# Patient Record
Sex: Female | Born: 1935 | Race: White | Hispanic: No | Marital: Single | Smoking: Never smoker
Health system: Southern US, Community
[De-identification: ages and names within clinical notes are randomized; demographics above are authoritative.]

## PROBLEM LIST (undated history)

## (undated) DIAGNOSIS — I10 Essential (primary) hypertension: Secondary | ICD-10-CM

## (undated) DIAGNOSIS — F79 Unspecified intellectual disabilities: Secondary | ICD-10-CM

## (undated) DIAGNOSIS — M1990 Unspecified osteoarthritis, unspecified site: Secondary | ICD-10-CM

---

## 1998-01-07 ENCOUNTER — Emergency Department (HOSPITAL_COMMUNITY): Admission: EM | Admit: 1998-01-07 | Discharge: 1998-01-07 | Payer: Self-pay | Admitting: Emergency Medicine

## 1999-01-03 ENCOUNTER — Other Ambulatory Visit: Admission: RE | Admit: 1999-01-03 | Discharge: 1999-01-03 | Payer: Self-pay | Admitting: Family Medicine

## 1999-02-14 ENCOUNTER — Encounter: Admission: RE | Admit: 1999-02-14 | Discharge: 1999-02-14 | Payer: Self-pay | Admitting: Family Medicine

## 1999-02-14 ENCOUNTER — Encounter: Payer: Self-pay | Admitting: Family Medicine

## 1999-11-30 ENCOUNTER — Emergency Department (HOSPITAL_COMMUNITY): Admission: EM | Admit: 1999-11-30 | Discharge: 1999-11-30 | Payer: Self-pay | Admitting: Emergency Medicine

## 2000-02-21 ENCOUNTER — Encounter: Admission: RE | Admit: 2000-02-21 | Discharge: 2000-02-21 | Payer: Self-pay | Admitting: Family Medicine

## 2000-02-21 ENCOUNTER — Encounter: Payer: Self-pay | Admitting: Family Medicine

## 2001-02-05 ENCOUNTER — Emergency Department (HOSPITAL_COMMUNITY): Admission: EM | Admit: 2001-02-05 | Discharge: 2001-02-05 | Payer: Self-pay | Admitting: Emergency Medicine

## 2001-02-05 ENCOUNTER — Encounter: Payer: Self-pay | Admitting: Emergency Medicine

## 2001-02-25 ENCOUNTER — Encounter: Admission: RE | Admit: 2001-02-25 | Discharge: 2001-02-25 | Payer: Self-pay | Admitting: Family Medicine

## 2001-02-25 ENCOUNTER — Encounter: Payer: Self-pay | Admitting: Family Medicine

## 2002-03-31 ENCOUNTER — Encounter: Payer: Self-pay | Admitting: Family Medicine

## 2002-03-31 ENCOUNTER — Ambulatory Visit (HOSPITAL_COMMUNITY): Admission: RE | Admit: 2002-03-31 | Discharge: 2002-03-31 | Payer: Self-pay | Admitting: Family Medicine

## 2002-07-29 ENCOUNTER — Encounter: Payer: Self-pay | Admitting: Family Medicine

## 2002-07-29 ENCOUNTER — Encounter: Admission: RE | Admit: 2002-07-29 | Discharge: 2002-07-29 | Payer: Self-pay | Admitting: Family Medicine

## 2003-04-27 ENCOUNTER — Ambulatory Visit (HOSPITAL_COMMUNITY): Admission: RE | Admit: 2003-04-27 | Discharge: 2003-04-27 | Payer: Self-pay | Admitting: Family Medicine

## 2003-05-04 ENCOUNTER — Encounter: Admission: RE | Admit: 2003-05-04 | Discharge: 2003-05-04 | Payer: Self-pay | Admitting: Family Medicine

## 2003-05-31 ENCOUNTER — Inpatient Hospital Stay (HOSPITAL_COMMUNITY): Admission: EM | Admit: 2003-05-31 | Discharge: 2003-06-15 | Payer: Self-pay | Admitting: Emergency Medicine

## 2003-06-18 ENCOUNTER — Encounter: Admission: RE | Admit: 2003-06-18 | Discharge: 2003-09-16 | Payer: Self-pay | Admitting: Internal Medicine

## 2003-10-05 ENCOUNTER — Emergency Department (HOSPITAL_COMMUNITY): Admission: EM | Admit: 2003-10-05 | Discharge: 2003-10-05 | Payer: Self-pay | Admitting: Emergency Medicine

## 2003-11-18 ENCOUNTER — Inpatient Hospital Stay (HOSPITAL_COMMUNITY): Admission: RE | Admit: 2003-11-18 | Discharge: 2003-11-20 | Payer: Self-pay | Admitting: Specialist

## 2004-02-22 ENCOUNTER — Encounter: Admission: RE | Admit: 2004-02-22 | Discharge: 2004-02-22 | Payer: Self-pay | Admitting: Family Medicine

## 2004-07-01 ENCOUNTER — Ambulatory Visit (HOSPITAL_COMMUNITY): Admission: RE | Admit: 2004-07-01 | Discharge: 2004-07-01 | Payer: Self-pay | Admitting: Family Medicine

## 2004-08-13 ENCOUNTER — Emergency Department (HOSPITAL_COMMUNITY): Admission: EM | Admit: 2004-08-13 | Discharge: 2004-08-14 | Payer: Self-pay | Admitting: Emergency Medicine

## 2005-06-24 IMAGING — CR DG CHEST 2V
2 series · 2 of 2 positions shown · non-contrast
Comparison: none

CLINICAL DATA: Shortness of breath with cough.
 AP AND LATERAL CHEST ? 05/31/03 
 Patchy parenchymal opacities, left lower lobe, question infectious infiltrate.  Negative for edema.  The heart is upper normal.  Diffuse degenerative changes are seen within the spine.

[view not recorded (1 of 2)]
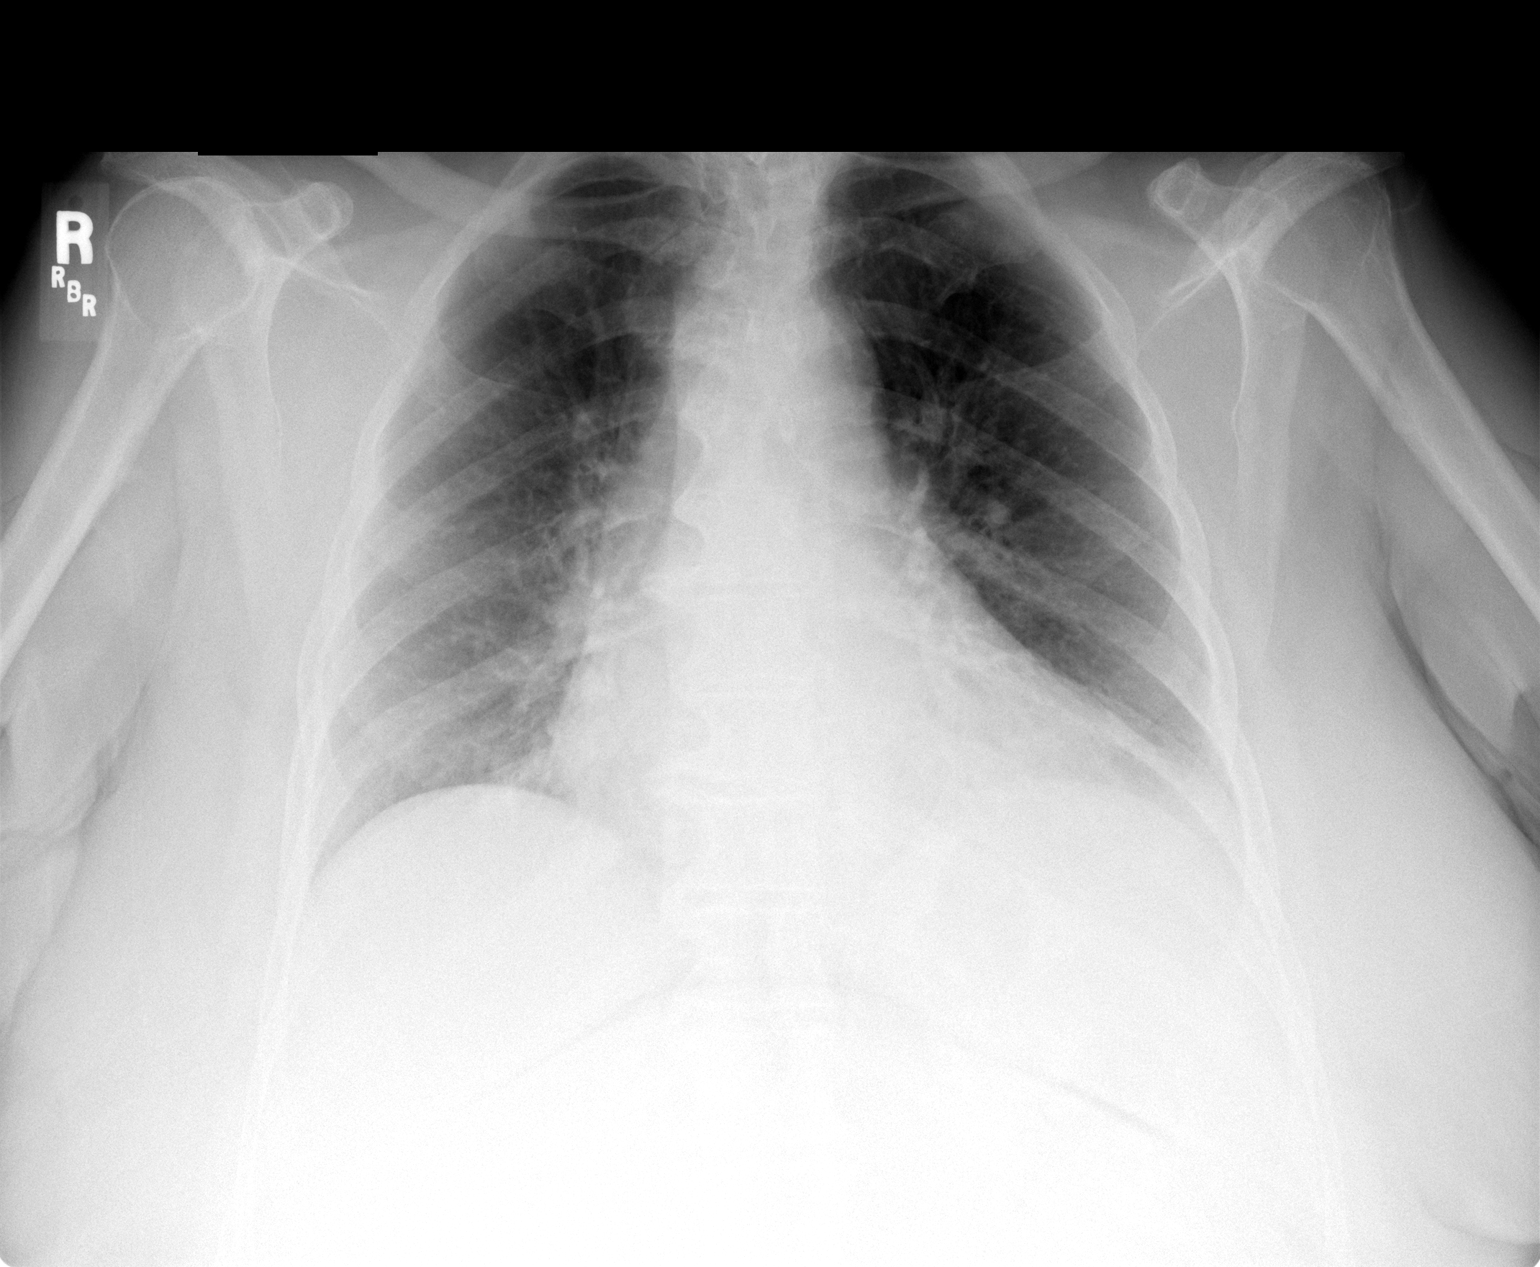

[view not recorded (2 of 2)]
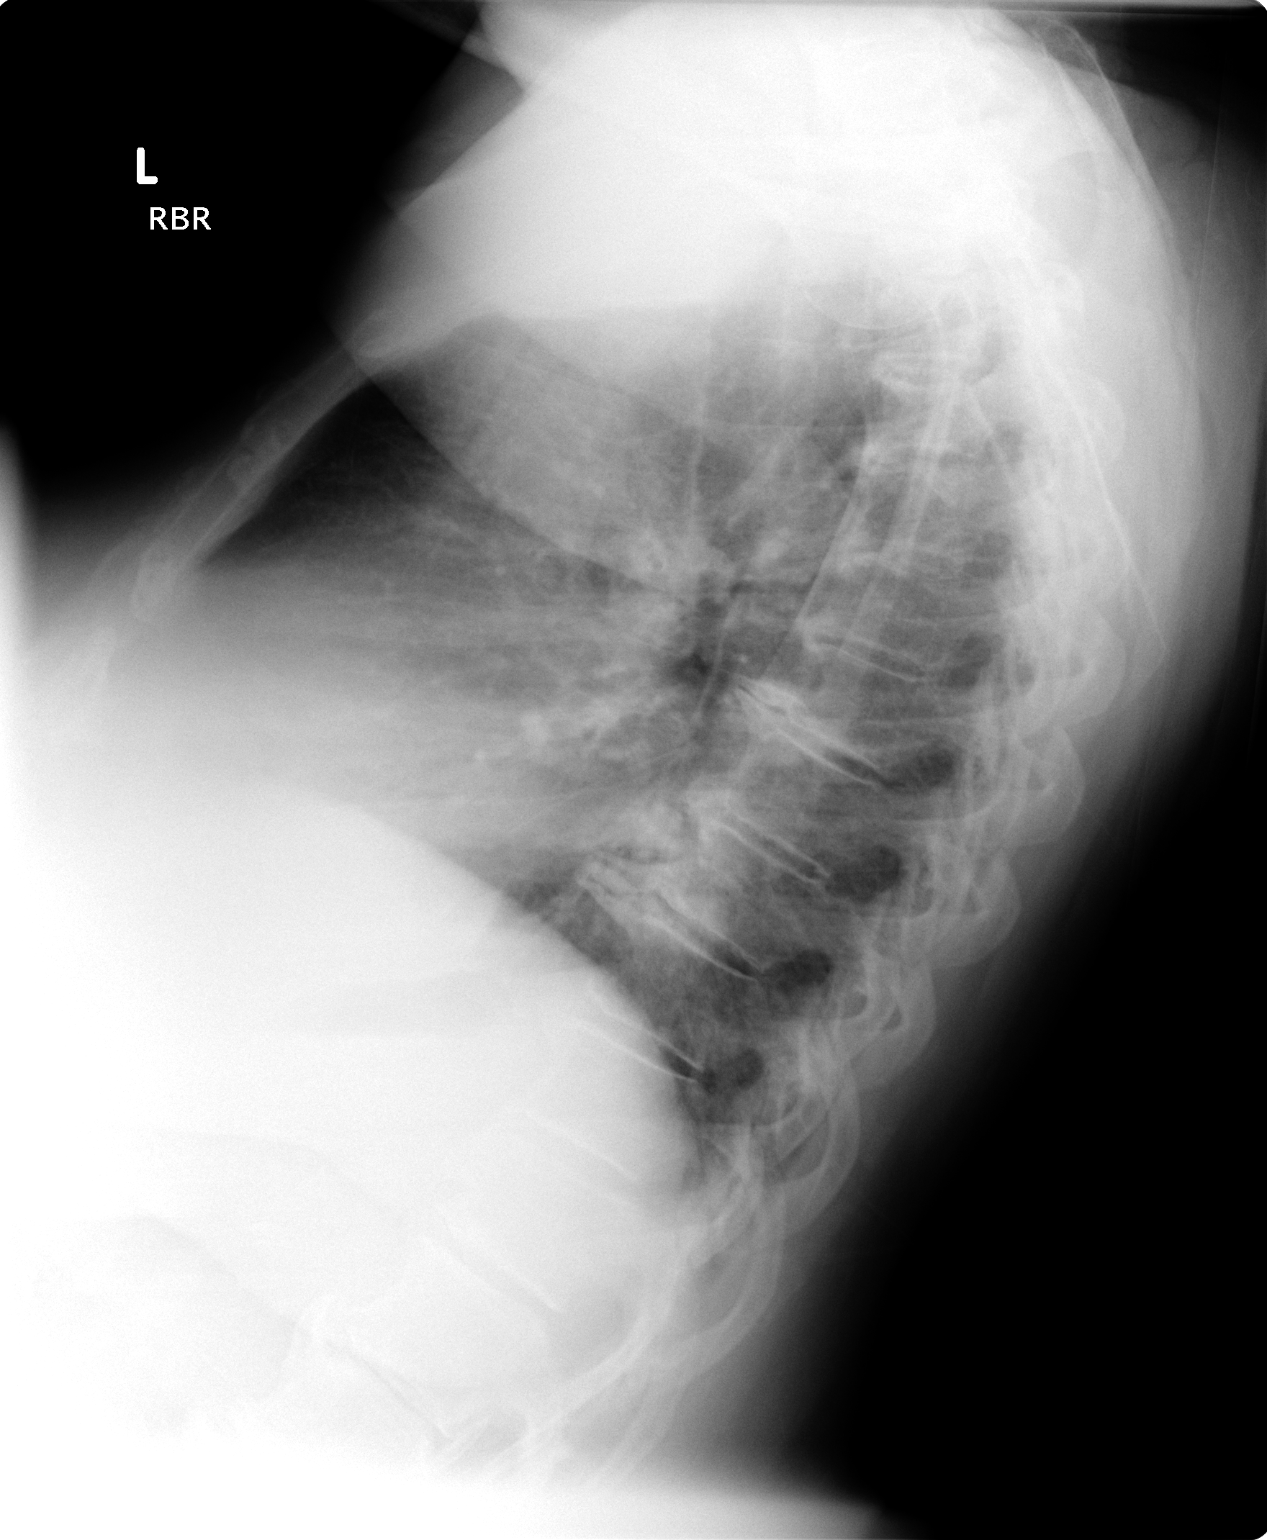

[2 of 2 positions shown; findings below may reference images not displayed]

IMPRESSION: Question left lower lobe infiltrate.

## 2005-08-25 ENCOUNTER — Ambulatory Visit (HOSPITAL_COMMUNITY): Admission: RE | Admit: 2005-08-25 | Discharge: 2005-08-25 | Payer: Self-pay | Admitting: Family Medicine

## 2005-09-11 ENCOUNTER — Encounter: Admission: RE | Admit: 2005-09-11 | Discharge: 2005-09-11 | Payer: Self-pay | Admitting: Family Medicine

## 2006-09-12 ENCOUNTER — Ambulatory Visit (HOSPITAL_COMMUNITY): Admission: RE | Admit: 2006-09-12 | Discharge: 2006-09-12 | Payer: Self-pay | Admitting: Family Medicine

## 2007-04-02 ENCOUNTER — Encounter: Admission: RE | Admit: 2007-04-02 | Discharge: 2007-04-02 | Payer: Self-pay | Admitting: Family Medicine

## 2008-08-28 ENCOUNTER — Ambulatory Visit (HOSPITAL_COMMUNITY): Admission: RE | Admit: 2008-08-28 | Discharge: 2008-08-28 | Payer: Self-pay | Admitting: Family Medicine

## 2008-12-16 ENCOUNTER — Encounter: Admission: RE | Admit: 2008-12-16 | Discharge: 2008-12-16 | Payer: Self-pay | Admitting: Family Medicine

## 2010-04-16 ENCOUNTER — Encounter: Payer: Self-pay | Admitting: Family Medicine

## 2010-06-03 ENCOUNTER — Other Ambulatory Visit (HOSPITAL_COMMUNITY): Payer: Self-pay | Admitting: Family Medicine

## 2010-06-03 DIAGNOSIS — Z1231 Encounter for screening mammogram for malignant neoplasm of breast: Secondary | ICD-10-CM

## 2010-06-27 ENCOUNTER — Ambulatory Visit (HOSPITAL_COMMUNITY)
Admission: RE | Admit: 2010-06-27 | Discharge: 2010-06-27 | Disposition: A | Discharge status: Home / Self Care | Payer: Medicare Other | Source: Ambulatory Visit | Attending: Family Medicine | Admitting: Family Medicine

## 2010-06-27 DIAGNOSIS — Z1231 Encounter for screening mammogram for malignant neoplasm of breast: Secondary | ICD-10-CM | POA: Insufficient documentation
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
## 2010-08-12 NOTE — Op Note (Signed)
NAME:  Deborah Watkins, Deborah Watkins                       ACCOUNT NO.:  [id number]   MEDICAL RECORD NO.:  [id number]                   PATIENT TYPE:  INP   LOCATION:  0008                                 FACILITY:  [facility]   PHYSICIAN:  Jene Every, M.D.                 DATE OF BIRTH:  Jul 10, 1930   DATE OF PROCEDURE:  11/18/2003  DATE OF DISCHARGE:                                 OPERATIVE REPORT   PREOPERATIVE DIAGNOSIS:  A four-part right proximal humerus fracture with  comminuted humeral head.   POSTOPERATIVE DIAGNOSIS:  A four-part right proximal humerus fracture with  comminuted humeral head.   PROCEDURE PERFORMED:  Right shoulder hemiarthroplasty.   ANESTHESIA:  General.   SURGEON:  Jene Every, M.D.   ASSISTANT:  Roma Schanz, P.A.   INDICATIONS:  This is a 78 year old female who fell at work with a  comminuted proximal humerus fracture.  Operative intervention was indicated  for hemiarthroplasty to replace the nonviable head.  Risks and benefits were  discussed, including bleeding, infection, damage to vascular structures,  suboptimal range of motion, need for revision, DVT, etc.   TECHNIQUE:  Patient is placed in a beach-chair position.  After induction of  adequate general anesthesia and 1 gm of Kefzol, the right shoulder  precordial region was prepped and draped in the usual sterile fashion.  An  incision was made from the distal part of the clavicle over the coracoid and  down to the anterior aspect of the arm.  Subcutaneous tissue was dissected,  bipolar cautery utilized to achieve hemostasis.  The interval between the  deltoid and pectoralis was identified.  The cephalic vein was identified and  preserved, retracted laterally after medial tributaries were cauterized and  mobilized.  General retractors were allowed to retract the deltoid  laterally.  With a Cobb beneath the insertion of the pectoralis muscle, we  incised the top portion of the tendinous portion of  the pectoralis muscle.  We divided the clavipectoral fascia just lateral to the conjoint tendon.  Palpated the musculocutaneous nerve just underneath the conjoint tendon.  It  was preserved at all times.  Next, I identified the bicipital groove.  The  fractured humeral head was noted.  It was severely deformed and comminuted.  There was some tearing of the subscap noted as well.  I divided the  transverse humeral ligament after identifying the biceps tendon.  We carried  that up to the rotator interval, up to the coracoid.  We divided the lesser  and greater tuberosities, utilizing the osteotome.  No significant traction  was applied inferiorly, externally, or internally during the whole case.  We  then mobilized the lesser and greater tuberosities with their soft tissue  attachments.  The humeral head was comminuted.  We retrieved it from the  glenoid in multiple pieces.  We debulked the lesser and greater  tuberosities.  We placed an Ethibond suture around the  osteotendinous  portion of the supraspinatus.  Again, we palpated the axillary nerve just  below the subscap.  This was protected at all times.  After mobilizing the  lesser and greater tuberosity, we examined again the glenoid.  There were  some articular fragments that were removed.  A rongeur was utilized to  smooth some spikes in the humerus.  The wound was copiously irrigated.  I  reamed the canal of the humerus, and it would only accept a 6.  I used a 6  prosthesis with the DePuy global unit with the anterior fin in line with the  bicipital groove, which gave the appropriate amount of retroversion, 30  degrees.  The height was measured with a trial 6 head.  It was reduced and  found to fit the glenoid appropriately.  There was normal, less than 50%  subluxation anterior, posterior, and inferior tension.  This was then  removed.  The canal irrigated.  Cement mixed.  There was no cement  restricter that would fit.  We cemented a  6 in the appropriate version with  an anterior fin in line with the bicipital groove.  We placed an Ethibond  through the medial fin hole.  In the appropriate version after appropriate  curing, again, we put a permanent humeral head on the moist taper and packed  it.  Reduced again.  It was excellent reduction.  We copiously irrigated it.  Next, drill holes were placed in the proximal humerus.  Six holes were  placed.  Ethibonds were placed through each of these holes.  This is  reserved for later reconstruction.  Next, morselized cancellous bone graft  in the humeral head was then removed and packed around the throat of the  prosthesis.  The tuberosities were debulked.  They were brought to the  midline over the anterior fin of the bicipital groove, collapsed with a  tenaculum.  The Ethibond sutures had been secured through the anterior hole  fin and through both tuberosities.  I also tied in the Ethibonds through the  six holes through the proximal humerus and into the tuberosities in the soft  tissue as well, fully seating the prosthesis and stabilizing it.  Excellent  good range of motion without dislocation is noted.  We over-sewed with #1  Vicryl interrupted figure-of-eight sutures and repaired the rotator cuff  interval as well.  We then placed a drain and brought it out through a  lateral stab wound in the skin.  Repaired the deltopectoral fascia.  Electrocautery was utilized to achieve hemostasis.  Again the ____________  cephalic vein had been preserved.  Loosely closed the deltopectoral interval  and then the subcutaneous tissue with 0 and 2-0 Vicryl simple sutures.  The  skin was reapproximated with staples.  The wound was dressed sterilely.  It  was placed in the mobilizer with good pulses.  Following the procedure, he  was extubated without difficulty and transported to the recovery room in  satisfactory condition.  No complications.  Blood loss 320.                                               Jene Every, M.D.    Cordelia Pen  D:  11/18/2003  T:  11/18/2003  Job:  [id number]

## 2010-08-12 NOTE — Discharge Summary (Signed)
NAME:  Deborah Watkins, Deborah Watkins                       ACCOUNT NO.:  [id number]   MEDICAL RECORD NO.:  [id number]                   PATIENT TYPE:  INP   LOCATION:  5715                                 FACILITY:  [facility]   PHYSICIAN:  Isla Pence, M.D.             DATE OF BIRTH:  11-30-1930   DATE OF ADMISSION:  05/31/2003  DATE OF DISCHARGE:  06/15/2003                                 DISCHARGE SUMMARY   DISCHARGE DIAGNOSES:  1. Left lower lobe infiltrate.  2. Cholecystitis.  3. Newly diagnosed diabetes mellitus with hemoglobin A1C during this     admission at 7.3, currently primarily diet controlled.  4. Left shoulder calcified tendonitis.  5. Transient mild hyponatremia with sodium on the day of discharge at 136     which has normalized.  6. History of hypertension.  7. History of hyperlipidemia.  8. Question of single-vessel coronary artery disease.   DISCHARGE MEDICATIONS:  1. Albuterol and Atrovent nebulizers four times a day for one more week     while completing her antibiotic treatment.  2. Norvasc 5 mg p.o. daily.  3. Ecotrin 81 mg p.o. daily.  4. Lipitor 10 mg p.o. each evening.  5. Cipro 500 mg p.o. b.i.d. until complete dose on 28 March.  6. Flagyl 500 mg p.o. four times a day until 26 March.  7. Colace 100 mg p.o. b.i.d. if constipated.  8. Pepcid 20 mg p.o. b.i.d. x 1 more week for stomach prophylaxis.  9. Avapro 150 mg p.o. daily, and this is new for the patient.  She had come     in on Lisinopril and hydrochlorothiazide.  I am not really very sure why     she was ever started back that and instead Avapro was chosen.  10.      Darvocet-N 100 one to two tablets p.o. q.4-6h. p.r.n. pain for her     left shoulder.   The patient has been advised not to restart Lisinopril/hydrochlorothiazide  combination until she sees Dr. Arvilla Market.   ACTIVITY:  No restrictions.   DIET:  1500 calorie ADA diet.   FOLLOW UP:  1. Follow up with Dr. Arvilla Market in one to two weeks.  2.  The patient's sister was advised to try to get the Glucomander since     outpatient clinic may carry samples rather than me writing a prescription     since her sugars have not been terribly bad.  The sister will be taught     how to do the finger sticks here.  3. I would recommend trying to set the sister up as an outpatient for     diabetes education.  4. Also with Dr. Darnell Level for followup on her gallbladder in     approximately three weeks.  This is especially in interest of getting     surgery done.  5. Dr. Ollen Gross from orthopedics in three to four weeks to  see if     steroid injection is possible for her left shoulder tendonitis.  And this     will be after her acute infectious process.   HOSPITAL COURSE:  This 77 year old white female, with mild to moderate  mental retardation, was brought to the emergency room with one-week history  of increased cough with fever that developed on the day of admission. She  also developed some weakness per a family member who is taking care of her.  Apparently she was also stumbling at home.  She was subsequently found to  have bibasilar infiltrates with an initial white count of 19,900, hemoglobin  13.8, platelet count 258,000.  Differential showed 90% PMNs.  The patient  was admitted, placed on IV Rocephin and azithromycin and also started on  nebulizer treatments for better pulmonary toilet.   As mentioned earlier, she was improving on this regimen, and her white count  on March 8 had normalized at 10.5, and then subsequently after that started  rising again to a maximum of 18,200 on 12 March.  She was recultured  including blood cultures, UA with C&S, and chest x-ray.  Blood culture and  urine cultures have been negative.  In fact the urine culture actually  showed insignificant growth.  Her UA was totally negative.  Her chest x-ray  suggested worsening air space disease in the left lower lobe.  In addition,  she was also having some  abdominal distention and pain. An acute abdominal  series was done which showed no free air and no evidence of fecal impaction,  just showed some mild ileus.  Abdominal ultrasound was done, and that showed  contracted gallbladder with gallstones.   On 14 March, she was started on Flagyl 250 p.o. four times a day with  concern for C. difficile colitis, and she was also complaining of diarrhea.  Her C. difficile's have been negative, and in fact most likely her diarrhea  was coming from Colace.  On 15 March, she had a repeat chest x-ray that  showed the persistent of left lower lobe air space disease.  At that time,  the patient was switched to Cipro and had Flagyl increased also pending C.  difficile cultures for concerns for C. difficile colitis.  Once it was  discovered that she had the gallstones and the contracted gallbladder, she  as subsequently switched to IV Flagyl, and the Cipro was also converted to  IV.   General surgery was consulted, and, as expected, they were concerned with  taking the patient to the OR with her acute pneumonic process but also at  the same time felt that clinically she appeared to be doing okay.  She  really was not complaining of pain after that initial first day.  Her ileus  resolved.  She was tolerating good oral intake without any further abdominal  pain.  We did do a HIDA scan, and that showed not much activity within the  gallbladder.  The radiologist was thinking that maybe she might have  blockage in the cystic duct, but surgery was not too impressed.  Her common  bile duct was not dilated, and there was no intrahepatic dilation either.   Therefore, the patient was kept on IV antibiotics until March 20 and  subsequently switched to oral antibiotics on March 20.  Her white count had  normalized with this regimen for two days prior to this discharge.  On March 20, her white count was 10.6, and based on that  the IV antibiotics were  switched to oral.   On the day of discharge, her white count is 9200.  All  parties concerned, and that includes the general surgeon, felt comfortable  with the patient being discharged to home.  The patient and family also were  very anxious for discharge.   The only other issue that has developed during her hospital stay here has  been the newly diagnosed diabetes mellitus as mentioned earlier.  Most of  her finger sticks have been very good, running mostly in the range anywhere  from 75 to 146 with an occasional rise to 250 over the past couple of days,  and this may be more related to when the did the finger sticks.  The sister  will be educated on doing finger sticks.  I informed the sister that, since  the sugars are really for the most part doing very well, instead of my  writing a prescription for Glucometer, this sometimes can be provided  through samples from the clinic.  I recommended that she do that through Dr.  Roselie Skinner clinic.  I was of the understanding when I came on seven days ago  that patient's family had already received diabetes education; however, I  will try to set that up prior to the patient's discharge.  Otherwise, this  will need to be addressed by Dr. Arvilla Market at the time of followup.   The patient did have some mild intermittent hyponatremia, but this was felt  most likely due to some decreased oral intake.  She was given some IV fluids  during this admission intermittently, and at the time of discharge, her  sodium is at 136, and this is without any IV fluids for the past couple of  days.   In regards to her complaints of left shoulder pain, Dr. Ollen Gross was  consulted, and he was of the opinion that this was calcified tendonitis.  In  light of the current infectious process, he was concerned about giving intra-  articular steroid injections and just suggested some pain control.  The  patient is to see him in followup for further treatment on this once the  infectious  process is taken care of.   As I mentioned earlier, all of her blood cultures have remained negative.  Her urine culture showed insignificant growth with a UA that was totally  negative.  Would recommend followup chest x-ray ion approximately four weeks  to make sure that chest findings of the infiltrative process have cleared  up.                                                Isla Pence, M.D.    RRV/MEDQ  D:  06/15/2003  T:  06/16/2003  Job:  [id number]   cc:   Donia Guiles, M.D.  [address] [place]  [place] [address]  Fax: [phone]

## 2010-08-12 NOTE — Consult Note (Signed)
NAME:  Deborah Watkins, Deborah Watkins                       ACCOUNT NO.:  [id number]   MEDICAL RECORD NO.:  [id number]                   PATIENT TYPE:  INP   LOCATION:  5715                                 FACILITY:  [facility]   PHYSICIAN:  Velora Heckler, M.D.                DATE OF BIRTH:  03-31-1930   DATE OF CONSULTATION:  06/11/2003  DATE OF DISCHARGE:                                   CONSULTATION   REFERRING PHYSICIAN:  Isla Pence, M.D.   REASON FOR CONSULTATION:  Abdominal pain, cholelithiasis.   HISTORY OF PRESENT ILLNESS:  Deborah Watkins is a pleasant 77 year old white  female admitted May 31, 2003, with falls, cough, fever, and left lower lobe  infiltrate with diagnosis of pneumonia.  The patient has mild mental  retardation.  She has moderate obesity.  She has had a protracted hospital  course with workup for hyperglycemia and hypertension during this hospital  stay.  The patient now has mild intermittent abdominal pain, a rising white  blood cell count, and gallstones found on abdominal ultrasound.  General  surgery is consulted for evaluation and recommendations.   PAST MEDICAL HISTORY:  1. History of hypertension.  2. History of hypercholesterolemia.  3. History of degenerative joint disease.  4. History of mental retardation.  5. History of moderate obesity.  6. Status post bilateral cataract surgery.  7. Status post abdominal hysterectomy.   MEDICATIONS:  Norvasc, Lipitor, aspirin, Lisinopril.   ALLERGIES:  None known.   SOCIAL HISTORY:  The patient is accompanied by her sister who has power of  attorney.  She denies alcohol and tobacco use.   REVIEW OF SYSTEMS:  Significant findings include left shoulder pain.   FAMILY HISTORY:  Noncontributory.   PHYSICAL EXAMINATION:  GENERAL:  A 77 year old, bright, alert white female  in no acute distress, seated in a chair on [address].  VITAL SIGNS:  Temperature 98.6, pulse 86, respirations 20, blood pressure  154/94, O2  saturation 95% on room air.  HEENT:  Normocephalic and atraumatic.  Sclerae are clear.  Conjunctivae are  clear.  Dentition shows full upper and lower plates.  Mucous membranes are  moist.  NECK:  Supple without mass.  There is no asymmetry.  There are no palpable  lymph nodes.  Thyroid is normal without nodularity.  CHEST:  Clear bilaterally without rales, rhonchi, or wheezes.  CARDIAC:  Regular rate and rhythm without murmur.  Peripheral pulses are  full.  ABDOMEN:  Soft, obese, with bowel sounds present.  There is a well-healed  midline incision extending from the upper abdomen to nearly the pubis.  There are no palpable masses.  There is no significant guarding.  There is  no rebound tenderness.  There is likely a reducible umbilical hernia which  is asymptomatic.  EXTREMITIES:  Showed 2+ edema to the ankles.  NEUROLOGIC:  The patient is oriented with obvious mild mental impairment.  She  has no focal neurologic deficits.   LABORATORY DATA:  White count 16.4, hemoglobin 12.4, platelets 511,000.  As  of March 16, electrolytes were normal, and liver function tests were normal.  As of June 08, 2003, amylase was normal at 24, and lipase was normal at 20.   RADIOGRAPHIC STUDIES:  Ultrasound of the abdomen is reviewed.  It shows  changes consistent with fatty infiltration of the liver.  There is a  contracted gallbladder with stones.  There is no biliary dilatation.  There  are no inflammatory changes.   IMPRESSION:  1. Cholelithiasis.  2. Leukocytosis.   PLAN:  I do not feel the patient has acute cholecystitis.  There are no  clinical nor radiographic findings to suggest acute cholecystitis with  active infection of the gallbladder or biliary tract at this time.  I will  order a nuclear medicine hepatobiliary scan for Friday, March 18.  I will  also allow the patient to begin a clear liquid diet.  We will also check a  two-view abdominal x-ray while the patient is in radiology on  March 18 due  to abdominal distention.  Certainly if the nuclear scan is abnormal, we may  reevaluate for possible surgical intervention.  However, at this time it is  likely that her gallstones are minimally symptomatic or asymptomatic.  I  cannot explain her leukocytosis due to cholelithiasis. We will follow  closely with you.                                               Velora Heckler, M.D.    TMG/MEDQ  D:  06/11/2003  T:  06/14/2003  Job:  [id number]   cc:   Isla Pence, M.D.   Donia Guiles, M.D.  [address] [place]  [place] [address]  Fax: [phone]   [facility]

## 2010-08-12 NOTE — H&P (Signed)
NAME:  Deborah Watkins, Deborah Watkins                       ACCOUNT NO.:  [id number]   MEDICAL RECORD NO.:  [id number]                   PATIENT TYPE:  INP   LOCATION:  5715                                 FACILITY:  [facility]   PHYSICIAN:  Candyce Churn, M.D.          DATE OF BIRTH:  March 08, 1931   DATE OF ADMISSION:  05/31/2003  DATE OF DISCHARGE:                                HISTORY & PHYSICAL   PRIMARY CARE PHYSICIAN:  Donia Guiles, M.D.   CARDIOLOGIST:  Candace Cruise, M.D.   CHIEF COMPLAINT:  Falls, cough, fever, and left lower lobe infiltrate.   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 Hypertension.  2. Hypercholesterolemia.  3. History of question single-vessel coronary artery disease.  4. DJD of the knees and shoulders.  5. She also has obesity.   She presents with approximately one week of increased cough with fever today  and weakness, and she was stumbling at home and brought to the [facility]  emergency room by her family for evaluation. Because of her tenuous status  with her ability to ambulate, weakness, and fever, and mental retardation,  it was felt that she would best be managed at least for the next 24 to 48  hours at [facility] while being treated for a left lower lobe pneumonia.   Will admit and treat with IV antibiotic therapy and observe.   MEDICATIONS:  Medications chronically include:  1. Norvasc 5 mg daily.  2. Lipitor 10 mg daily.  3. One aspirin daily.  4. Lisinopril HCT 20/12.5 daily.   FAMILY HISTORY:  Significant for coronary artery disease, diabetes, thyroid  disease.   PAST SURGICAL HISTORY:  1. Status post bilateral cataract extractions with IOLs.  2. TAH/BSO.   SOCIAL HISTORY:  The patient was never married. She lives with her sister.  She has eighth grade function cognitively by present family members in the  emergency  room. No tobacco or alcohol use. Her powers of attorney are her  brother and her sister, and her sister, Gillis Ends, can be reached at [phone].   REVIEW OF SYSTEMS:  The patient does complain of left shoulder pain though  this is chronic. She does have left shoulder DJD. She denies chest pain or  shortness of breath but she does have a cough. She denies any nausea,  vomiting, diarrhea. No headache. Family members are helpful with the  history.   PHYSICAL EXAMINATION:  GENERAL:  Obese, alert female in no acute distress,  but she is coughing.  VITAL SIGNS:  Temperature is 100, pulse is 110, respiratory rate is 30 and  nonlabored, O2 saturation is 95% on room air, blood pressure 125/52.  HEENT:  Noted to have bilateral lens implants and status post cataract  extraction bilaterally. Her oropharynx is mildly dry. Extraocular movements  intact.  NECK:  Supple without obvious JVD or thyromegaly. No bruits heard.  CHEST:  Has coarse rhonchi bilaterally. No rales.  CARDIAC:  Reveals a regular rhythm. No murmurs or gallops.  BREASTS:  Not examined.  ABDOMEN:  Obese, soft, nontender, bowel sounds are normal.  EXTREMITIES:  Without edema. Left shoulder with no erythema or deformity.  Pulses are intact distally.  NEUROLOGICAL:  She is alert and nonfocal.   STUDIES:  Chest x-ray reveals a question of left lower lobe infiltrate. EKG  reveals sinus rhythm at 84 beats per minute. No ST elevation or depression.  White count is 19,900, hemoglobin 13.8, platelet count 258,000. White cell  differential reveals 90% PMNs. Sodium 132, potassium 3.7, chloride 100, BUN  16, creatinine 0.8, glucose 209. Urinalysis reveals specific gravity 1.039,  pH is 5.5, glucose is 100, blood is large, protein is greater than 300, 0 to  2 white cells, 7 to 10 red cells, few bacteria.   ASSESSMENT:  A 77 year old female with weakness, fever, probable left lower  lobe pneumonia, and she has mild to moderate mental retardation.  The family  needs help with her therapy and care acutely. Hopefully can be discharged  the next 24 to 48 hours. Also has hyperglycemia.   PLAN:  1. Fever, cough, left lower lobe infiltrate. Treat with IV Rocephin and     azithromycin and also Xopenex nebulizers and nasal cannula O2 p.r.n.  2. Hypertension. Continue Norvasc. Change to an ARB because of her cough.     Will hold hydrochlorothiazide for now.  3. Hyperglycemia. No concentrated sweets. Check CBG b.i.d. Check hemoglobin     A1c.  4. Hematuria. Check repeat UA, catheterize, in a.m.  5. Questionable history of coronary artery disease. Continue baby aspirin     and check cardiac enzymes in a.m. GI prophylaxis with Pepcid.  6. Degenerative joint disease of left shoulder. Try Darvocet-N 100 one to     two p.o. t.i.d. p.r.n.                                                Candyce Churn, M.D.    RNG/MEDQ  D:  05/31/2003  T:  06/01/2003  Job:  [id number]   cc:   Donia Guiles, M.D.  [address] [place]  [place] [address]  Fax: [phone]   Candace Cruise, M.D.

## 2010-08-12 NOTE — Consult Note (Signed)
NAME:  Deborah Watkins, Deborah Watkins                       ACCOUNT NO.:  [id number]   MEDICAL RECORD NO.:  [id number]                   PATIENT TYPE:  INP   LOCATION:  5715                                 FACILITY:  [facility]   PHYSICIAN:  Ollen Gross, M.D.                 DATE OF BIRTH:  1930-06-25   DATE OF CONSULTATION:  06/03/2003  DATE OF DISCHARGE:                                   CONSULTATION   REFERRING PHYSICIAN:  Melissa L. Ladona Ridgel, M.D.   REASON FOR CONSULTATION:  Left shoulder pain.   HISTORY OF PRESENT ILLNESS:  The patient is a 77 year old female with  admission to [facility] for left lower lobe pneumonia.  She has had  recent left shoulder pain. She has chronically had left shoulder pain, in  fact, had a frozen shoulder sometime last year. This was treated with  physical therapy and a cortisone injection and eventually resolved.  She  previously was on Vioxx and/or Celebrex and did not have shoulder pain while  on those medications.  Those medications were stopped in the past few months  because of the recent concerns with the Cox II inhibitors.  She subsequently  has had an increase in shoulder pain which has gotten worse in the past  month or so. She has not had any recent injury which has caused this. She  has pain at rest or with activity.  She does not note any difficulty  sleeping. She is not having any radiating pain down her arm or any upper  extremity paresthesias.   PHYSICAL EXAMINATION:  GENERAL:  On examination today, she is in no apparent  distress.  MUSCULOSKELETAL:  Left shoulder active elevation about 160, abduction  approximately 140, external rotation 40. Passively, she has minimal range of  motion.  She has positive impingement to thrust __________  elevation.  There is some tenderness at the [facility] joint.  There is no swelling, redness, or  fluctuance about the shoulder.  Her strength testing is inconsistent and is  only about 4/5 with abduction or  external rotation, but is difficult to  ascertain if she is truly resisting or not.   Radiographs show AC arthritis and some glenohumeral degenerative changes  with some calcific changes in the rotator cuff tendon. No evidence of any  acute bony abnormalities.   ASSESSMENT:  Left shoulder pain.  She has chronic calcific tendonitis  impingement syndrome.  I would recommend getting her back on an anti-  inflammatory now to treat the symptoms.  She may end up needing a cortisone  injection, but I would not do that now in light of her having a pneumonia.  Once she is discharged from the hospital, I have recommended to her and her  family to follow up with [place] an outpatient basis and at that point, I will  determine whether or now we should pursue an MRI.  We may also pursue  repeat  physical therapy or injection.  I would just recommend getting her started  on an appropriate anti-inflammatory again and she will follow up with [place] on  an outpatient basis.                                               Ollen Gross, M.D.    FA/MEDQ  D:  06/03/2003  T:  06/05/2003  Job:  [id number]   cc:   Melissa L. Ladona Ridgel, MD  [address] [place], [place] [address]  Fax: [phone]

## 2011-08-28 ENCOUNTER — Other Ambulatory Visit (HOSPITAL_COMMUNITY): Payer: Self-pay | Admitting: Family Medicine

## 2011-08-28 DIAGNOSIS — Z1231 Encounter for screening mammogram for malignant neoplasm of breast: Secondary | ICD-10-CM

## 2011-09-25 ENCOUNTER — Ambulatory Visit (HOSPITAL_COMMUNITY)
Admission: RE | Admit: 2011-09-25 | Discharge: 2011-09-25 | Disposition: A | Discharge status: Home / Self Care | Payer: Medicare Other | Source: Ambulatory Visit | Attending: Family Medicine | Admitting: Family Medicine

## 2011-09-25 DIAGNOSIS — Z1231 Encounter for screening mammogram for malignant neoplasm of breast: Secondary | ICD-10-CM | POA: Insufficient documentation

## 2011-12-25 ENCOUNTER — Emergency Department (HOSPITAL_COMMUNITY): Payer: Medicare Other

## 2011-12-25 ENCOUNTER — Emergency Department (HOSPITAL_COMMUNITY)
Admission: EM | Admit: 2011-12-25 | Discharge: 2011-12-25 | Disposition: A | Discharge status: Home / Self Care | Payer: Medicare Other | Attending: Emergency Medicine | Admitting: Emergency Medicine

## 2011-12-25 ENCOUNTER — Encounter (HOSPITAL_COMMUNITY): Payer: Self-pay | Admitting: Family Medicine

## 2011-12-25 DIAGNOSIS — F79 Unspecified intellectual disabilities: Secondary | ICD-10-CM | POA: Insufficient documentation

## 2011-12-25 DIAGNOSIS — R059 Cough, unspecified: Secondary | ICD-10-CM | POA: Insufficient documentation

## 2011-12-25 DIAGNOSIS — E119 Type 2 diabetes mellitus without complications: Secondary | ICD-10-CM | POA: Insufficient documentation

## 2011-12-25 DIAGNOSIS — I10 Essential (primary) hypertension: Secondary | ICD-10-CM | POA: Insufficient documentation

## 2011-12-25 DIAGNOSIS — N390 Urinary tract infection, site not specified: Secondary | ICD-10-CM | POA: Insufficient documentation

## 2011-12-25 DIAGNOSIS — R531 Weakness: Secondary | ICD-10-CM

## 2011-12-25 DIAGNOSIS — R05 Cough: Secondary | ICD-10-CM | POA: Insufficient documentation

## 2011-12-25 DIAGNOSIS — R5381 Other malaise: Secondary | ICD-10-CM | POA: Insufficient documentation

## 2011-12-25 DIAGNOSIS — R269 Unspecified abnormalities of gait and mobility: Secondary | ICD-10-CM | POA: Insufficient documentation

## 2011-12-25 HISTORY — DX: Essential (primary) hypertension: I10

## 2011-12-25 HISTORY — DX: Unspecified intellectual disabilities: F79

## 2011-12-25 LAB — COMPREHENSIVE METABOLIC PANEL
AST: 21 U/L (ref 0–37)
Albumin: 3.7 g/dL (ref 3.5–5.2)
Calcium: 9.4 mg/dL (ref 8.4–10.5)
Creatinine, Ser: 0.67 mg/dL (ref 0.50–1.10)
GFR calc non Af Amer: 81 mL/min — ABNORMAL LOW (ref >90)
Total Protein: 6.9 g/dL (ref 6.0–8.3)

## 2011-12-25 LAB — DIFFERENTIAL
Basophils Absolute: 0 10*3/uL (ref 0.0–0.1)
Basophils Relative: 0 % (ref 0–1)
Eosinophils Absolute: 0 10*3/uL (ref 0.0–0.7)
Eosinophils Relative: 0 % (ref 0–5)
Monocytes Absolute: 0.3 10*3/uL (ref 0.1–1.0)

## 2011-12-25 LAB — URINALYSIS, ROUTINE W REFLEX MICROSCOPIC
Glucose, UA: 250 mg/dL — AB
Leukocytes, UA: NEGATIVE
Protein, ur: 100 mg/dL — AB
pH: 6 (ref 5.0–8.0)

## 2011-12-25 LAB — CBC
HCT: 40 % (ref 36.0–46.0)
MCHC: 31.5 g/dL (ref 30.0–36.0)
MCV: 84.2 fL (ref 78.0–100.0)
RDW: 14.8 % (ref 11.5–15.5)

## 2011-12-25 LAB — PROTIME-INR: Prothrombin Time: 13.2 seconds (ref 11.6–15.2)

## 2011-12-25 LAB — URINE MICROSCOPIC-ADD ON

## 2011-12-25 LAB — GLUCOSE, CAPILLARY: Glucose-Capillary: 100 mg/dL — ABNORMAL HIGH (ref 70–99)

## 2011-12-25 MED ORDER — DEXTROSE 5 % IV SOLN
1.0000 g | INTRAVENOUS | Status: DC
  Administered 2011-12-25: 1 g via INTRAVENOUS
  Filled 2011-12-25: qty 10

## 2011-12-25 MED ORDER — CEFUROXIME AXETIL 250 MG PO TABS: 250.0000 mg | ORAL_TABLET | Freq: Two times a day (BID) | ORAL | Status: DC

## 2011-12-25 NOTE — ED Provider Notes (Signed)
History     CSN: [id number] Arrival date & time 12/25/11  4696 First MD Initiated Contact with Patient 12/25/11 [phone]      Chief Complaint  Patient presents with  . Hypertension   Leveal 5: MR, history from family  HPI Family has history of MR.  Pt is cared for her by family.  She was more unsteady than usual and was tremulous when they were trying to get her ready and bathe this am.  She did not fall but required more assistance.  She seemed to be leaning toward the sign. No slurred speech.  NO facial droop or obvious limb weakness.   Her blood pressure was checked and it was elevated (180/100s).  She has had some cough.  No fevers.  NO vomiting, some loose stools but this is normal.  She has not complained of pain but generally never does.  No similar events in the past.   Past Medical History  Diagnosis Date  . Mental retardation   . Hypertension   . Diabetes mellitus     History reviewed. No pertinent past surgical history.  History reviewed. No pertinent family history.  History  Substance Use Topics  . Smoking status: Never Smoker   . Smokeless tobacco: Not on file  . Alcohol Use: No    OB History    Grav Para Term Preterm Abortions TAB SAB Ect Mult Living                  Review of Systems  All other systems reviewed and are negative.    Allergies  Review of patient's allergies indicates not on file.  Home Medications  No current outpatient prescriptions on file.  BP 152/75  Pulse 90  Temp 98.3 F (36.8 C)  Resp 18  SpO2 99%  Physical Exam  Nursing note and vitals reviewed. Constitutional: She is oriented to person, place, and time. She appears well-developed and well-nourished. No distress.  HENT:  Head: Normocephalic and atraumatic.  Right Ear: External ear normal.  Left Ear: External ear normal.  Mouth/Throat: Oropharynx is clear and moist.  Eyes: Conjunctivae normal are normal. Right eye exhibits no discharge. Left eye exhibits no  discharge. No scleral icterus.  Neck: Neck supple. No tracheal deviation present.  Cardiovascular: Normal rate, regular rhythm and intact distal pulses.   Pulmonary/Chest: Effort normal and breath sounds normal. No stridor. No respiratory distress. She has no wheezes. She has no rales.  Abdominal: Soft. Bowel sounds are normal. She exhibits no distension. There is no tenderness. There is no rebound and no guarding.  Musculoskeletal: She exhibits no edema and no tenderness.  Neurological: She is alert and oriented to person, place, and time. She has normal strength. No cranial nerve deficit ( no gross defecits noted) or sensory deficit. She exhibits normal muscle tone. She displays no seizure activity. Gait abnormal.       No pronator drift bilateral upper extrem, able to hold both legs off bed for 5 seconds although with some difficulty, sensation intact in all extremities, no visual field cuts, no left or right sided neglect  Skin: Skin is warm and dry. No rash noted.  Psychiatric: She has a normal mood and affect.    ED Course  Procedures (including critical care time)  Rate: 83  Rhythm: normal sinus rhythm  QRS Axis: normal  Intervals: normal  ST/T Wave abnormalities: normal  Conduction Disutrbances: Right bundle branch block  Old EKG Reviewed: Right bundle branch block is new  compared to EKG dated 08/13/2004  Labs Reviewed  DIFFERENTIAL - Abnormal; Notable for the following:    Neutrophils Relative 88 (*)     Neutro Abs 8.1 (*)     Lymphocytes Relative 8 (*)     All other components within normal limits  COMPREHENSIVE METABOLIC PANEL - Abnormal; Notable for the following:    Sodium 134 (*)     Glucose, Bld 202 (*)     Alkaline Phosphatase 138 (*)     GFR calc non Af Amer 81 (*)     All other components within normal limits  PROTIME-INR  APTT  CBC  TROPONIN I  URINALYSIS, ROUTINE W REFLEX MICROSCOPIC   Ct Head Wo Contrast  12/25/2011  *RADIOLOGY REPORT*  Clinical Data:  Hypertension, unsteady on feet.  CT HEAD WITHOUT CONTRAST  Technique:  Contiguous axial images were obtained from the base of the skull through the vertex without contrast.  Comparison: None.  Findings: Bony calvarium appears intact.  No mass effect or midline shift is noted.  Ventricular size is within normal limits.  There is no evidence of mass, hemorrhage or infarction.  IMPRESSION: No gross intracranial abnormality seen.   Original Report Authenticated By: Venita Sheffield., M.D.    Dg Chest Portable 1 View  12/25/2011  *RADIOLOGY REPORT*  Clinical Data: 78 year old female with acute hypertension.  Ataxia  PORTABLE CHEST - 1 VIEW  Comparison: 04/02/2007  Findings: The cardiomediastinal silhouette is unremarkable. There is no evidence of focal airspace disease, pulmonary edema, suspicious pulmonary nodule/mass, pleural effusion, or pneumothorax. No acute bony abnormalities are identified. Right shoulder hemiarthroplasty again noted.  IMPRESSION: No evidence of active cardiopulmonary disease.   Original Report Authenticated By: Rosendo Gros, M.D.      1. UTI (urinary tract infection)   2. Weakness generalized       MDM  The patient has been able to ambulate here in the emergency room. Per family, she seems to be at her baseline.  The patient's evaluation here is unremarkable other than the probable urinary tract infection. I explained to the family that the urinary tract infection could have caused the weakness that she experienced.  I certainly cannot exclude the possibility of occult stroke or TIA however she's not having any focal deficits now and seems to be able to walk like she usually does with assistance. At this time I feel it is reasonable discharge her home on a prescription for antibiotics for the UTI. Close followup with her primary Dr. as indicated. She is to return to emergency room for any recurrent or worsening symptoms       Celene Kras, MD 12/25/11 1133

## 2011-12-25 NOTE — ED Notes (Addendum)
Per pt family pt woke up this am shaking all over and unsteady on her feet worse than usual. sts checked BP and was extremely elevated.

## 2011-12-27 LAB — URINE CULTURE

## 2011-12-28 NOTE — ED Notes (Signed)
+   Urine Patient treated with bactrim-sensitive to same-chart appended per protocol MD. 

## 2012-01-06 ENCOUNTER — Emergency Department (HOSPITAL_COMMUNITY): Payer: Medicare Other

## 2012-01-06 ENCOUNTER — Emergency Department (HOSPITAL_COMMUNITY)
Admission: EM | Admit: 2012-01-06 | Discharge: 2012-01-06 | Disposition: A | Discharge status: Home / Self Care | Payer: Medicare Other | Attending: Emergency Medicine | Admitting: Emergency Medicine

## 2012-01-06 ENCOUNTER — Encounter (HOSPITAL_COMMUNITY): Payer: Self-pay | Admitting: *Deleted

## 2012-01-06 DIAGNOSIS — E119 Type 2 diabetes mellitus without complications: Secondary | ICD-10-CM | POA: Insufficient documentation

## 2012-01-06 DIAGNOSIS — Z7982 Long term (current) use of aspirin: Secondary | ICD-10-CM | POA: Insufficient documentation

## 2012-01-06 DIAGNOSIS — S0991XA Unspecified injury of ear, initial encounter: Secondary | ICD-10-CM

## 2012-01-06 DIAGNOSIS — F79 Unspecified intellectual disabilities: Secondary | ICD-10-CM | POA: Insufficient documentation

## 2012-01-06 DIAGNOSIS — W19XXXA Unspecified fall, initial encounter: Secondary | ICD-10-CM

## 2012-01-06 DIAGNOSIS — S0993XA Unspecified injury of face, initial encounter: Secondary | ICD-10-CM | POA: Insufficient documentation

## 2012-01-06 DIAGNOSIS — Z79899 Other long term (current) drug therapy: Secondary | ICD-10-CM | POA: Insufficient documentation

## 2012-01-06 DIAGNOSIS — T148XXA Other injury of unspecified body region, initial encounter: Secondary | ICD-10-CM

## 2012-01-06 DIAGNOSIS — I10 Essential (primary) hypertension: Secondary | ICD-10-CM | POA: Insufficient documentation

## 2012-01-06 DIAGNOSIS — N390 Urinary tract infection, site not specified: Secondary | ICD-10-CM | POA: Insufficient documentation

## 2012-01-06 DIAGNOSIS — W1809XA Striking against other object with subsequent fall, initial encounter: Secondary | ICD-10-CM | POA: Insufficient documentation

## 2012-01-06 MED ORDER — IBUPROFEN 400 MG PO TABS: 400.0000 mg | ORAL_TABLET | Freq: Four times a day (QID) | ORAL | Status: DC | PRN

## 2012-01-06 MED ORDER — HYDROCORTISONE 1 % EX CREA: TOPICAL_CREAM | CUTANEOUS | Status: DC

## 2012-01-06 NOTE — ED Provider Notes (Addendum)
History    This chart was scribed for Derwood Kaplan, MD, MD by Smitty Pluck. The patient was seen in room TR07C and the patient's care was started at 12:18PM.   CSN: [id number]  Arrival date & time 01/06/12  1057       Chief Complaint  Patient presents with  . Fall    (Consider location/radiation/quality/duration/timing/severity/associated sxs/prior treatment) The history is provided by a relative. No language interpreter was used.   Deborah Watkins is a 78 y.o. female with hx of DM, MR and HTN who presents to the Emergency Department BIB sister due to fall onset this morning at 7AM. Pt tripped on chair and hit her left ear. She denies any pain but sister states that she never complains of pain. Pt has UTI and is currently taking abx. She takes 81 mg asa.  Past Medical History  Diagnosis Date  . Mental retardation   . Hypertension   . Diabetes mellitus     History reviewed. No pertinent past surgical history.  No family history on file.  History  Substance Use Topics  . Smoking status: Never Smoker   . Smokeless tobacco: Not on file  . Alcohol Use: No    OB History    Grav Para Term Preterm Abortions TAB SAB Ect Mult Living                  Review of Systems  All other systems reviewed and are negative.    Allergies  Review of patient's allergies indicates no known allergies.  Home Medications   Current Outpatient Rx  Name Route Sig Dispense Refill  . ASPIRIN 81 MG PO TABS Oral Take 81 mg by mouth daily.    . ATORVASTATIN CALCIUM 10 MG PO TABS Oral Take 10 mg by mouth at bedtime.    Marland Kitchen CEFUROXIME AXETIL 250 MG PO TABS Oral Take 1 tablet (250 mg total) by mouth 2 (two) times daily. 14 tablet 0  . VITAMIN D 2000 UNITS PO CAPS Oral Take 2,000 Units by mouth daily.    . OMEGA-3 FATTY ACIDS 1000 MG PO CAPS Oral Take 1 g by mouth 2 (two) times daily.    Marland Kitchen GLIMEPIRIDE 1 MG PO TABS Oral Take 1 mg by mouth daily before breakfast.    . OVER THE COUNTER  MEDICATION Oral Take 2 tablets by mouth 2 (two) times daily as needed. Over the counter  Arthritis medication  For pain    . SERTRALINE HCL 100 MG PO TABS Oral Take 100 mg by mouth daily.    Marland Kitchen VALSARTAN-HYDROCHLOROTHIAZIDE 160-25 MG PO TABS Oral Take 1 tablet by mouth daily.      BP 99/53  Pulse 70  Temp 97.9 F (36.6 C) (Oral)  Resp 20  SpO2 97%  Physical Exam  Nursing note and vitals reviewed. Constitutional: She is oriented to person, place, and time. She appears well-developed and well-nourished. No distress.  HENT:  Head: Normocephalic and atraumatic.       No hematoma No active bleeding of skull Superior left ear has 3 cm abrasion with skin loss. No hematoma of left ear Negative battle signs   Cardiovascular: Normal rate, regular rhythm and normal heart sounds.   Pulmonary/Chest: Effort normal and breath sounds normal. No respiratory distress. She has no wheezes. She has no rales.  Abdominal: Bowel sounds are normal.  Musculoskeletal:       No step off or deformity of spine  Neurological: She is alert and oriented  to person, place, and time.  Skin: Skin is warm and dry.  Psychiatric: She has a normal mood and affect. Her behavior is normal.    ED Course  Irrigation and debridement Date/Time: 01/06/2012 1:18 PM Performed by: Derwood Kaplan Authorized by: Derwood Kaplan Consent: Verbal consent obtained. Risks and benefits: risks, benefits and alternatives were discussed Consent given by: power of attorney Patient identity confirmed: verbally with patient Time out: Immediately prior to procedure a "time out" was called to verify the correct patient, procedure, equipment, support staff and site/side marked as required. Preparation: Patient was prepped and draped in the usual sterile fashion. Local anesthesia used: no Patient sedated: no Patient tolerance: Patient tolerated the procedure well with no immediate complications. Comments: The loose skin from the helix  of the left ear was removed. The wound was irrigated with saline, and then cleaned with betadine. Bacitracin ointment placed, and dressing applied.   (including critical care time) DIAGNOSTIC STUDIES: Oxygen Saturation is 97% on room air, normal by my interpretation.    COORDINATION OF CARE: 12:24 PM Discussed ED treatment with pt     Labs Reviewed - No data to display No results found.   No diagnosis found.    MDM  Medical screening examination/treatment/procedure(s) were performed by me as the supervising physician. Scribe service was utilized for documentation only.  DDx includes: - Mechanical falls - ICH - Fractures - Contusions - Soft tissue injury  Pt comes in post fall from this am. Will get CT head due to age, and unreliable hx as patient has some retardation.  The helix of the left ear superiorly has essentially "degloved" and peeled off. We will cut the attachments. No laceration seen. No hematoma.    Derwood Kaplan, MD 01/06/12 1252  Derwood Kaplan, MD 01/06/12 1320

## 2012-01-06 NOTE — ED Notes (Signed)
Patient reported to fall, hit a chair and injured her ear.  Patient resides with her sister. Patient has hx of htn and diabetes.  Patient is being treated for uti.  Patient has hx of mr as well

## 2012-01-06 NOTE — ED Notes (Signed)
Sister states diagnoses of UTI September 30th.  Given IV antibiotics and currently taking oral antibiotics for UTI.

## 2012-05-14 ENCOUNTER — Emergency Department (HOSPITAL_COMMUNITY)
Admission: EM | Admit: 2012-05-14 | Discharge: 2012-05-14 | Disposition: A | Discharge status: Home / Self Care | Payer: Medicare Other | Attending: Emergency Medicine | Admitting: Emergency Medicine

## 2012-05-14 ENCOUNTER — Encounter (HOSPITAL_COMMUNITY): Payer: Self-pay | Admitting: *Deleted

## 2012-05-14 DIAGNOSIS — I10 Essential (primary) hypertension: Secondary | ICD-10-CM | POA: Insufficient documentation

## 2012-05-14 DIAGNOSIS — Z7982 Long term (current) use of aspirin: Secondary | ICD-10-CM | POA: Insufficient documentation

## 2012-05-14 DIAGNOSIS — S0003XA Contusion of scalp, initial encounter: Secondary | ICD-10-CM | POA: Insufficient documentation

## 2012-05-14 DIAGNOSIS — R296 Repeated falls: Secondary | ICD-10-CM | POA: Insufficient documentation

## 2012-05-14 DIAGNOSIS — E119 Type 2 diabetes mellitus without complications: Secondary | ICD-10-CM | POA: Insufficient documentation

## 2012-05-14 DIAGNOSIS — Y92009 Unspecified place in unspecified non-institutional (private) residence as the place of occurrence of the external cause: Secondary | ICD-10-CM | POA: Insufficient documentation

## 2012-05-14 DIAGNOSIS — F79 Unspecified intellectual disabilities: Secondary | ICD-10-CM | POA: Insufficient documentation

## 2012-05-14 DIAGNOSIS — Y939 Activity, unspecified: Secondary | ICD-10-CM | POA: Insufficient documentation

## 2012-05-14 DIAGNOSIS — W19XXXA Unspecified fall, initial encounter: Secondary | ICD-10-CM

## 2012-05-14 DIAGNOSIS — IMO0002 Reserved for concepts with insufficient information to code with codable children: Secondary | ICD-10-CM | POA: Insufficient documentation

## 2012-05-14 DIAGNOSIS — F0390 Unspecified dementia without behavioral disturbance: Secondary | ICD-10-CM | POA: Insufficient documentation

## 2012-05-14 DIAGNOSIS — Z79899 Other long term (current) drug therapy: Secondary | ICD-10-CM | POA: Insufficient documentation

## 2012-05-14 NOTE — ED Notes (Signed)
Pt lives at home with her sister.  Pt was in bathroom when she fell.  EMS states that there is a "small bump" on her head and a abrasion on left elbow.  No LOC.  Pt is alert and oriented at baseline (hx or MR).

## 2012-05-14 NOTE — ED Provider Notes (Signed)
History     CSN: [id number]  Arrival date & time 05/14/12  4098   First MD Initiated Contact with Patient 05/14/12 1143      Chief Complaint  Patient presents with  . Fall    (Consider location/radiation/quality/duration/timing/severity/associated sxs/prior treatment) Patient is a 79 y.o. female presenting with fall. The history is provided by a relative. No language interpreter was used.  Fall The accident occurred 1 to 2 hours ago (Pt felll in the bathroom at home.  She was not rendered unconscious.  She has bumps of the occipital region and the left forehead.). She fell from a height of 1 to 2 ft. She landed on a hard floor. The point of impact was the head. The pain is mild. She was ambulatory at the scene. There was no entrapment after the fall. There was no drug use involved in the accident. There was no alcohol use involved in the accident. Exacerbated by: Nothing. She has tried nothing for the symptoms.    Past Medical History  Diagnosis Date  . Mental retardation   . Hypertension   . Diabetes mellitus     History reviewed. No pertinent past surgical history.  No family history on file.  History  Substance Use Topics  . Smoking status: Never Smoker   . Smokeless tobacco: Not on file  . Alcohol Use: No    OB History   Grav Para Term Preterm Abortions TAB SAB Ect Mult Living                  Review of Systems  Unable to perform ROS: Dementia    Allergies  Review of patient's allergies indicates no known allergies.  Home Medications   Current Outpatient Rx  Name  Route  Sig  Dispense  Refill  . Acetaminophen (ACETAMIN PO)   Oral   Take 2 tablets by mouth 2 (two) times daily as needed. For arthritis pain         . aspirin 81 MG tablet   Oral   Take 81 mg by mouth daily.         Marland Kitchen atorvastatin (LIPITOR) 10 MG tablet   Oral   Take 10 mg by mouth at bedtime.         . Cholecalciferol (VITAMIN D) 2000 UNITS CAPS   Oral   Take 2,000 Units by  mouth daily.         . fish oil-omega-3 fatty acids 1000 MG capsule   Oral   Take 1 g by mouth 2 (two) times daily.         Marland Kitchen glimepiride (AMARYL) 1 MG tablet   Oral   Take 1 mg by mouth daily before breakfast.         . sertraline (ZOLOFT) 100 MG tablet   Oral   Take 100 mg by mouth daily.         . valsartan-hydrochlorothiazide (DIOVAN-HCT) 160-25 MG per tablet   Oral   Take 1 tablet by mouth daily.           BP 132/57  Pulse 77  Temp(Src) 98.1 F (36.7 C) (Oral)  Resp 18  SpO2 100%  Physical Exam  Constitutional: She appears well-developed and well-nourished. No distress.  HENT:  She has a 1 cm diameter contusion on the right occipital region, and a similar one on the left forehead.  There is no laceration and no bony deformity of the skull.  Eyes: Conjunctivae and EOM are normal. Pupils  are equal, round, and reactive to light.  Neck: Normal range of motion. Neck supple.  Cardiovascular: Normal rate, regular rhythm and normal heart sounds.   Pulmonary/Chest: Effort normal and breath sounds normal.  Abdominal: Soft. Bowel sounds are normal. She exhibits no distension. There is no tenderness.  Musculoskeletal: Normal range of motion. She exhibits no edema and no tenderness.  She has a 1 cm diameter superficial abrasion of the left elbow over the olecranon process.  Neurological: She is alert.  No sensory or motor deficit.  Skin: Skin is warm and dry.  Psychiatric:  Unable to assess.    ED Course  Procedures (including critical care time)  Pt's exam showed minor contusions of the scalp.  She is not on any anticoagulant.  No medical reason for imaging.  Reassured and released.    1. Darlin Coco III, MD 05/14/12 [phone]

## 2012-05-14 NOTE — ED Notes (Signed)
Pts sister is at the bedside,  She states that pt is acting herself

## 2013-03-23 ENCOUNTER — Emergency Department (HOSPITAL_COMMUNITY): Payer: Medicare Other

## 2013-03-23 ENCOUNTER — Inpatient Hospital Stay (HOSPITAL_COMMUNITY)
Admission: EM | Admit: 2013-03-23 | Discharge: 2013-03-29 | DRG: 202 | Disposition: A | Discharge status: Hospice | Payer: Medicare Other | Attending: Internal Medicine | Admitting: Internal Medicine

## 2013-03-23 ENCOUNTER — Encounter (HOSPITAL_COMMUNITY): Payer: Self-pay | Admitting: Emergency Medicine

## 2013-03-23 DIAGNOSIS — I5030 Unspecified diastolic (congestive) heart failure: Secondary | ICD-10-CM | POA: Diagnosis present

## 2013-03-23 DIAGNOSIS — E876 Hypokalemia: Secondary | ICD-10-CM | POA: Diagnosis present

## 2013-03-23 DIAGNOSIS — J209 Acute bronchitis, unspecified: Principal | ICD-10-CM | POA: Diagnosis present

## 2013-03-23 DIAGNOSIS — J9801 Acute bronchospasm: Secondary | ICD-10-CM

## 2013-03-23 DIAGNOSIS — I509 Heart failure, unspecified: Secondary | ICD-10-CM | POA: Diagnosis present

## 2013-03-23 DIAGNOSIS — Z515 Encounter for palliative care: Secondary | ICD-10-CM

## 2013-03-23 DIAGNOSIS — R1313 Dysphagia, pharyngeal phase: Secondary | ICD-10-CM | POA: Diagnosis present

## 2013-03-23 DIAGNOSIS — N390 Urinary tract infection, site not specified: Secondary | ICD-10-CM | POA: Diagnosis present

## 2013-03-23 DIAGNOSIS — R1310 Dysphagia, unspecified: Secondary | ICD-10-CM

## 2013-03-23 DIAGNOSIS — I10 Essential (primary) hypertension: Secondary | ICD-10-CM | POA: Diagnosis present

## 2013-03-23 DIAGNOSIS — Z66 Do not resuscitate: Secondary | ICD-10-CM | POA: Diagnosis present

## 2013-03-23 DIAGNOSIS — E871 Hypo-osmolality and hyponatremia: Secondary | ICD-10-CM

## 2013-03-23 DIAGNOSIS — R7989 Other specified abnormal findings of blood chemistry: Secondary | ICD-10-CM

## 2013-03-23 DIAGNOSIS — E119 Type 2 diabetes mellitus without complications: Secondary | ICD-10-CM | POA: Diagnosis present

## 2013-03-23 DIAGNOSIS — F79 Unspecified intellectual disabilities: Secondary | ICD-10-CM | POA: Diagnosis present

## 2013-03-23 DIAGNOSIS — R799 Abnormal finding of blood chemistry, unspecified: Secondary | ICD-10-CM

## 2013-03-23 DIAGNOSIS — A498 Other bacterial infections of unspecified site: Secondary | ICD-10-CM | POA: Diagnosis present

## 2013-03-23 HISTORY — DX: Unspecified osteoarthritis, unspecified site: M19.90

## 2013-03-23 LAB — URINALYSIS W MICROSCOPIC + REFLEX CULTURE
Glucose, UA: NEGATIVE mg/dL
Hgb urine dipstick: NEGATIVE
Specific Gravity, Urine: 1.016 (ref 1.005–1.030)

## 2013-03-23 LAB — CBC WITH DIFFERENTIAL/PLATELET
Basophils Relative: 0 % (ref 0–1)
Eosinophils Relative: 0 % (ref 0–5)
HCT: 35.2 % — ABNORMAL LOW (ref 36.0–46.0)
Hemoglobin: 11.4 g/dL — ABNORMAL LOW (ref 12.0–15.0)
MCH: 25.2 pg — ABNORMAL LOW (ref 26.0–34.0)
MCHC: 32.4 g/dL (ref 30.0–36.0)
MCV: 77.9 fL — ABNORMAL LOW (ref 78.0–100.0)
Monocytes Absolute: 0.6 10*3/uL (ref 0.1–1.0)
Monocytes Relative: 7 % (ref 3–12)
Neutro Abs: 8 10*3/uL — ABNORMAL HIGH (ref 1.7–7.7)
RDW: 16.9 % — ABNORMAL HIGH (ref 11.5–15.5)

## 2013-03-23 LAB — BASIC METABOLIC PANEL
BUN: 20 mg/dL (ref 6–23)
CO2: 32 mEq/L (ref 19–32)
Calcium: 9 mg/dL (ref 8.4–10.5)
Chloride: 93 mEq/L — ABNORMAL LOW (ref 96–112)
Creatinine, Ser: 0.79 mg/dL (ref 0.50–1.10)
GFR calc Af Amer: 89 mL/min — ABNORMAL LOW (ref >90)

## 2013-03-23 LAB — GLUCOSE, CAPILLARY: Glucose-Capillary: 82 mg/dL (ref 70–99)

## 2013-03-23 MED ORDER — HYDROCODONE-HOMATROPINE 5-1.5 MG/5ML PO SYRP: 5.0000 mL | ORAL_SOLUTION | Freq: Four times a day (QID) | ORAL | Status: DC | PRN

## 2013-03-23 MED ORDER — IPRATROPIUM BROMIDE 0.02 % IN SOLN
0.5000 mg | Freq: Four times a day (QID) | RESPIRATORY_TRACT | Status: DC
  Administered 2013-03-23: 0.5 mg via RESPIRATORY_TRACT
  Filled 2013-03-23: qty 2.5

## 2013-03-23 MED ORDER — ALBUTEROL SULFATE (5 MG/ML) 0.5% IN NEBU
2.5000 mg | INHALATION_SOLUTION | Freq: Four times a day (QID) | RESPIRATORY_TRACT | Status: DC
  Administered 2013-03-23: 2.5 mg via RESPIRATORY_TRACT
  Filled 2013-03-23: qty 0.5

## 2013-03-23 MED ORDER — METHYLPREDNISOLONE SODIUM SUCC 40 MG IJ SOLR
40.0000 mg | Freq: Four times a day (QID) | INTRAMUSCULAR | Status: DC
  Administered 2013-03-23 – 2013-03-24 (×3): 40 mg via INTRAVENOUS
  Filled 2013-03-23 (×6): qty 1

## 2013-03-23 MED ORDER — ONDANSETRON HCL 4 MG PO TABS: 4.0000 mg | ORAL_TABLET | Freq: Four times a day (QID) | ORAL | Status: DC | PRN

## 2013-03-23 MED ORDER — ONDANSETRON HCL 4 MG/2ML IJ SOLN: 4.0000 mg | Freq: Four times a day (QID) | INTRAMUSCULAR | Status: DC | PRN

## 2013-03-23 MED ORDER — ACETAMINOPHEN 325 MG PO TABS
650.0000 mg | ORAL_TABLET | Freq: Four times a day (QID) | ORAL | Status: DC | PRN
  Administered 2013-03-25: 650 mg via ORAL
  Filled 2013-03-23: qty 2

## 2013-03-23 MED ORDER — INSULIN ASPART 100 UNIT/ML ~~LOC~~ SOLN
0.0000 [IU] | Freq: Three times a day (TID) | SUBCUTANEOUS | Status: DC
  Administered 2013-03-24: 1 [IU] via SUBCUTANEOUS
  Administered 2013-03-24: 2 [IU] via SUBCUTANEOUS
  Administered 2013-03-25: 3 [IU] via SUBCUTANEOUS
  Administered 2013-03-25: 2 [IU] via SUBCUTANEOUS

## 2013-03-23 MED ORDER — ENOXAPARIN SODIUM 40 MG/0.4ML ~~LOC~~ SOLN
40.0000 mg | SUBCUTANEOUS | Status: DC
  Administered 2013-03-23 – 2013-03-27 (×5): 40 mg via SUBCUTANEOUS
  Filled 2013-03-23 (×6): qty 0.4

## 2013-03-23 MED ORDER — SODIUM CHLORIDE 0.9 % IV SOLN: 250.0000 mL | INTRAVENOUS | Status: DC | PRN

## 2013-03-23 MED ORDER — ACETAMINOPHEN 650 MG RE SUPP: 650.0000 mg | Freq: Four times a day (QID) | RECTAL | Status: DC | PRN

## 2013-03-23 MED ORDER — SODIUM CHLORIDE 0.9 % IJ SOLN: 3.0000 mL | INTRAMUSCULAR | Status: DC | PRN

## 2013-03-23 MED ORDER — ALBUTEROL SULFATE (5 MG/ML) 0.5% IN NEBU
2.5000 mg | INHALATION_SOLUTION | Freq: Three times a day (TID) | RESPIRATORY_TRACT | Status: DC
  Administered 2013-03-24: 2.5 mg via RESPIRATORY_TRACT
  Filled 2013-03-23: qty 0.5

## 2013-03-23 MED ORDER — ATORVASTATIN CALCIUM 10 MG PO TABS
10.0000 mg | ORAL_TABLET | Freq: Every day | ORAL | Status: DC
  Administered 2013-03-23 – 2013-03-28 (×6): 10 mg via ORAL
  Filled 2013-03-23 (×7): qty 1

## 2013-03-23 MED ORDER — ASPIRIN 81 MG PO TABS: 81.0000 mg | ORAL_TABLET | Freq: Every morning | ORAL | Status: DC

## 2013-03-23 MED ORDER — SODIUM CHLORIDE 0.9 % IV SOLN
INTRAVENOUS | Status: DC
  Administered 2013-03-23: 16:00:00 via INTRAVENOUS

## 2013-03-23 MED ORDER — ALBUTEROL SULFATE HFA 108 (90 BASE) MCG/ACT IN AERS
2.0000 | INHALATION_SPRAY | RESPIRATORY_TRACT | Status: AC
  Administered 2013-03-23: 2 via RESPIRATORY_TRACT
  Filled 2013-03-23: qty 6.7

## 2013-03-23 MED ORDER — LEVOFLOXACIN IN D5W 500 MG/100ML IV SOLN
500.0000 mg | INTRAVENOUS | Status: DC
  Administered 2013-03-23: 500 mg via INTRAVENOUS
  Filled 2013-03-23 (×2): qty 100

## 2013-03-23 MED ORDER — IPRATROPIUM BROMIDE 0.02 % IN SOLN
0.5000 mg | Freq: Once | RESPIRATORY_TRACT | Status: AC
  Administered 2013-03-23: 0.5 mg via RESPIRATORY_TRACT
  Filled 2013-03-23: qty 2.5

## 2013-03-23 MED ORDER — POTASSIUM CHLORIDE 10 MEQ/100ML IV SOLN
10.0000 meq | INTRAVENOUS | Status: AC
  Administered 2013-03-23 – 2013-03-24 (×3): 10 meq via INTRAVENOUS
  Filled 2013-03-23 (×3): qty 100

## 2013-03-23 MED ORDER — SODIUM CHLORIDE 0.9 % IJ SOLN
3.0000 mL | Freq: Two times a day (BID) | INTRAMUSCULAR | Status: DC
  Administered 2013-03-23 – 2013-03-28 (×9): 3 mL via INTRAVENOUS

## 2013-03-23 MED ORDER — ALBUTEROL SULFATE (2.5 MG/3ML) 0.083% IN NEBU: 2.5000 mg | INHALATION_SOLUTION | RESPIRATORY_TRACT | Status: DC | PRN

## 2013-03-23 MED ORDER — DEXTROSE 5 % IV SOLN
1.0000 g | Freq: Once | INTRAVENOUS | Status: AC
  Administered 2013-03-23: 1 g via INTRAVENOUS
  Filled 2013-03-23: qty 10

## 2013-03-23 MED ORDER — FUROSEMIDE 40 MG PO TABS
40.0000 mg | ORAL_TABLET | Freq: Two times a day (BID) | ORAL | Status: DC
  Administered 2013-03-24 (×2): 40 mg via ORAL
  Filled 2013-03-23 (×3): qty 1

## 2013-03-23 MED ORDER — FUROSEMIDE 10 MG/ML IJ SOLN: 40.0000 mg | Freq: Two times a day (BID) | INTRAMUSCULAR | Status: DC

## 2013-03-23 MED ORDER — IPRATROPIUM BROMIDE 0.02 % IN SOLN
0.5000 mg | Freq: Three times a day (TID) | RESPIRATORY_TRACT | Status: DC
  Administered 2013-03-24: 0.5 mg via RESPIRATORY_TRACT
  Filled 2013-03-23: qty 2.5

## 2013-03-23 MED ORDER — ALBUTEROL SULFATE (5 MG/ML) 0.5% IN NEBU
5.0000 mg | INHALATION_SOLUTION | Freq: Once | RESPIRATORY_TRACT | Status: AC
  Administered 2013-03-23: 5 mg via RESPIRATORY_TRACT
  Filled 2013-03-23: qty 1

## 2013-03-23 MED ORDER — SERTRALINE HCL 100 MG PO TABS
100.0000 mg | ORAL_TABLET | Freq: Every morning | ORAL | Status: DC
  Administered 2013-03-24 – 2013-03-29 (×6): 100 mg via ORAL
  Filled 2013-03-23 (×6): qty 1

## 2013-03-23 MED ORDER — ASPIRIN EC 81 MG PO TBEC
81.0000 mg | DELAYED_RELEASE_TABLET | Freq: Every day | ORAL | Status: DC
  Administered 2013-03-24 – 2013-03-29 (×6): 81 mg via ORAL
  Filled 2013-03-23 (×6): qty 1

## 2013-03-23 NOTE — ED Notes (Signed)
Patient back from  X-ray 

## 2013-03-23 NOTE — ED Notes (Signed)
Patient transported to X-ray 

## 2013-03-23 NOTE — ED Provider Notes (Signed)
CSN: [id number]     Arrival date & time 03/23/13  1112 History   First MD Initiated Contact with Patient 03/23/13 1137     Chief Complaint  Patient presents with  . URI  . Cough    Patient is a 79 y.o. female presenting with URI and cough. The history is provided by the patient and a caregiver. The history is limited by the condition of the patient and a developmental delay (Hx MR).  URI Presenting symptoms: cough   Cough  Pt was seen at 1150.  Per pt and his family, c/o gradual onset and persistence of constant cough for the past 3 to 4 days. Caregiver states "the entire household" has had the same symptoms. Denies fevers, no rash, no CP/SOB, no N/V/D, no abd pain. Pt has significant hx of MR.     Past Medical History  Diagnosis Date  . Mental retardation   . Hypertension   . Diabetes mellitus   . Arthritis    History reviewed. No pertinent past surgical history.  History  Substance Use Topics  . Smoking status: Never Smoker   . Smokeless tobacco: Not on file  . Alcohol Use: No    Review of Systems  Unable to perform ROS: Other  Respiratory: Positive for cough.       Allergies  Review of patient's allergies indicates no known allergies.  Home Medications   Current Outpatient Rx  Name  Route  Sig  Dispense  Refill  . aspirin 81 MG tablet   Oral   Take 81 mg by mouth every morning.          Marland Kitchen atorvastatin (LIPITOR) 10 MG tablet   Oral   Take 10 mg by mouth at bedtime.         . Cholecalciferol (VITAMIN D) 2000 UNITS CAPS   Oral   Take 2,000 Units by mouth every morning.          . fish oil-omega-3 fatty acids 1000 MG capsule   Oral   Take 1 g by mouth 2 (two) times daily.         . furosemide (LASIX) 20 MG tablet   Oral   Take 20-40 mg by mouth every morning.         Marland Kitchen glimepiride (AMARYL) 1 MG tablet   Oral   Take 1 mg by mouth daily before breakfast.         . hydrochlorothiazide (HYDRODIURIL) 25 MG tablet   Oral   Take 25 mg by  mouth every morning.         Marland Kitchen HYDROcodone-homatropine (HYCODAN) 5-1.5 MG/5ML syrup   Oral   Take 5 mLs by mouth every 6 (six) hours as needed for cough.         Marland Kitchen PRESCRIPTION MEDICATION   Oral   Take 20 mg by mouth every morning. Amlodipine 20 mg         . sertraline (ZOLOFT) 100 MG tablet   Oral   Take 100 mg by mouth every morning.           BP 127/45  Pulse 75  Temp(Src) 97.9 F (36.6 C) (Oral)  Resp 18  SpO2 100% Physical Exam 1155: Physical examination:  Nursing notes reviewed; Vital signs and O2 SAT reviewed;  Constitutional: Well developed, Well nourished, Well hydrated, In no acute distress; Head:  Normocephalic, atraumatic; Eyes: EOMI, PERRL, No scleral icterus; ENMT: TM's clear bilat. +edemetous nasal turbinates bilat with clear rhinorrhea. Mouth and  pharynx normal, Mucous membranes moist; Neck: Supple, Full range of motion, No lymphadenopathy; Cardiovascular: Regular rate and rhythm, No gallop; Respiratory: Breath sounds coarse & equal bilaterally, No wheezes. Speaking full sentences, Normal respiratory effort/excursion; Chest: Nontender, Movement normal; Abdomen: Soft, Nontender, Nondistended, Normal bowel sounds; Genitourinary: No CVA tenderness; Extremities: Pulses normal, No tenderness, No edema, No calf asymmetry.; Neuro: Awake, alert, at baseline per family at bedside. Speech clear. No facial droop. Moves all extremities on stretcher..; Skin: Color normal, Warm, Dry.   ED Course  Procedures    EKG Interpretation   None       MDM  MDM Reviewed: previous chart, nursing note and vitals Reviewed previous: labs and ECG Interpretation: x-ray, labs and ECG     Results for orders placed during the hospital encounter of 03/23/13  CBC WITH DIFFERENTIAL      Result Value Range   WBC 9.2  4.0 - 10.5 K/uL   RBC 4.52  3.87 - 5.11 MIL/uL   Hemoglobin 11.4 (*) 12.0 - 15.0 g/dL   HCT 40.9 (*) 81.1 - 91.4 %   MCV 77.9 (*) 78.0 - 100.0 fL   MCH 25.2 (*)  26.0 - 34.0 pg   MCHC 32.4  30.0 - 36.0 g/dL   RDW 78.2 (*) 95.6 - 21.3 %   Platelets 109 (*) 150 - 400 K/uL   Neutrophils Relative % 87 (*) 43 - 77 %   Neutro Abs 8.0 (*) 1.7 - 7.7 K/uL   Lymphocytes Relative 6 (*) 12 - 46 %   Lymphs Abs 0.6 (*) 0.7 - 4.0 K/uL   Monocytes Relative 7  3 - 12 %   Monocytes Absolute 0.6  0.1 - 1.0 K/uL   Eosinophils Relative 0  0 - 5 %   Eosinophils Absolute 0.0  0.0 - 0.7 K/uL   Basophils Relative 0  0 - 1 %   Basophils Absolute 0.0  0.0 - 0.1 K/uL  BASIC METABOLIC PANEL      Result Value Range   Sodium 136  135 - 145 mEq/L   Potassium 3.2 (*) 3.5 - 5.1 mEq/L   Chloride 93 (*) 96 - 112 mEq/L   CO2 32  19 - 32 mEq/L   Glucose, Bld 82  70 - 99 mg/dL   BUN 20  6 - 23 mg/dL   Creatinine, Ser 0.86  0.50 - 1.10 mg/dL   Calcium 9.0  8.4 - 57.8 mg/dL   GFR calc non Af Amer 77 (*) >90 mL/min   GFR calc Af Amer 89 (*) >90 mL/min  PRO B NATRIURETIC PEPTIDE      Result Value Range   Pro B Natriuretic peptide (BNP) 488.0 (*) 0 - 450 pg/mL  TROPONIN I      Result Value Range   Troponin I <0.30  <0.30 ng/mL  URINALYSIS W MICROSCOPIC + REFLEX CULTURE      Result Value Range   Color, Urine AMBER (*) YELLOW   APPearance CLOUDY (*) CLEAR   Specific Gravity, Urine 1.016  1.005 - 1.030   pH 5.0  5.0 - 8.0   Glucose, UA NEGATIVE  NEGATIVE mg/dL   Hgb urine dipstick NEGATIVE  NEGATIVE   Bilirubin Urine SMALL (*) NEGATIVE   Ketones, ur NEGATIVE  NEGATIVE mg/dL   Protein, ur NEGATIVE  NEGATIVE mg/dL   Urobilinogen, UA 1.0  0.0 - 1.0 mg/dL   Nitrite POSITIVE (*) NEGATIVE   Leukocytes, UA LARGE (*) NEGATIVE   WBC, UA 7-10  <3  WBC/hpf   Bacteria, UA MANY (*) RARE   Squamous Epithelial / LPF MANY (*) RARE   Casts HYALINE CASTS (*) NEGATIVE  GLUCOSE, CAPILLARY      Result Value Range   Glucose-Capillary 82  70 - 99 mg/dL   Dg Chest 2 View 91/47/8295   CLINICAL DATA:  Coughing and congestion and dyspnea with history of diabetes and hypertension  EXAM: CHEST  2  VIEW  COMPARISON:  PA and lateral chest x-ray of June 24, 2012  FINDINGS: The lungs are reasonably well inflated. The interstitial markings are mildly increased. The cardiopericardial silhouette is enlarged. The pulmonary vascularity is engorged. There is no significant pleural effusion. 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 There are degenerative disc changes at multiple thoracic levels.  IMPRESSION: The findings are consistent with congestive heart failure with pulmonary vascular congestion and mild interstitial edema. There is no focal pneumonia.   Electronically Signed   By: David  [place]   On: 03/23/2013 12:39    1605:  Pt ambulated only a short distance (a much shorter distance that she has to walk at home) with increasing generalized weakness and SOB. Sats remained 95% R/A. Denied CP/abd pain. Pt continues with moist cough during exam. CXR indicates CHF, but BNP 488. Family states pt "takes lasix for her ankle swelling," no hx of CHF per their recollection.  SBP labile, afebrile, mentating per her baseline. +UTI, UC pending; will start IV rocephin. Will obtain influenza PCR. Dx and testing d/w pt and family.  Questions answered.  Verb understanding, agreeable to admit.   T/C to [facility] Dr. Sharl Ma, case discussed, including:  HPI, pertinent PM/SHx, VS/PE, dx testing, ED course and treatment:  Agreeable to admit, requests to write temporary orders, obtain tele bed to team 10.   Laray Anger, DO 03/26/13 1444

## 2013-03-23 NOTE — H&P (Signed)
PCP:   Deborah Raider, MD   Chief Complaint:  Cough  HPI:  79 year old female who  has a past medical history of Mental retardation; Hypertension; Diabetes mellitus; and Arthritis. Presented to the ED with chief complaint of cough, patient has history of mental retardation and is poor historian. History is obtained from patient's sister at bedside. Has persisted patient has had persistent cough at first she 4 days. She denies chest pain no shortness of breath no nausea vomiting diarrhea no fever. In the ED chest x-ray showed interstitial edema and BNP is elevated to 488. Patient received albuterol and Rocephin. Still continues to cough. Patient takes Lasix at home for leg edema. She is also on HCTZ 25 mg by mouth daily  Allergies:  No Known Allergies    Past Medical History  Diagnosis Date  . Mental retardation   . Hypertension   . Diabetes mellitus   . Arthritis     History reviewed. No pertinent past surgical history.  Prior to Admission medications   Medication Sig Start Date End Date Taking? Authorizing Provider  aspirin 81 MG tablet Take 81 mg by mouth every morning.    Yes Historical Provider, MD  atorvastatin (LIPITOR) 10 MG tablet Take 10 mg by mouth at bedtime.   Yes Historical Provider, MD  Cholecalciferol (VITAMIN D) 2000 UNITS CAPS Take 2,000 Units by mouth every morning.    Yes Historical Provider, MD  fish oil-omega-3 fatty acids 1000 MG capsule Take 1 g by mouth 2 (two) times daily.   Yes Historical Provider, MD  furosemide (LASIX) 20 MG tablet Take 20-40 mg by mouth every morning.   Yes Historical Provider, MD  glimepiride (AMARYL) 1 MG tablet Take 1 mg by mouth daily before breakfast.   Yes Historical Provider, MD  hydrochlorothiazide (HYDRODIURIL) 25 MG tablet Take 25 mg by mouth every morning.   Yes Historical Provider, MD  HYDROcodone-homatropine (HYCODAN) 5-1.5 MG/5ML syrup Take 5 mLs by mouth every 6 (six) hours as needed for cough.   Yes Historical Provider,  MD  PRESCRIPTION MEDICATION Take 20 mg by mouth every morning. Amlodipine 20 mg   Yes Historical Provider, MD  sertraline (ZOLOFT) 100 MG tablet Take 100 mg by mouth every morning.    Yes Historical Provider, MD    Social History:  reports that she has never smoked. She does not have any smokeless tobacco history on file. She reports that she does not drink alcohol. Her drug history is not on file.  History reviewed. No pertinent family history.   All the positives are listed in BOLD  Review of Systems:  HEENT: Headache, blurred vision, runny nose, sore throat Neck: Hypothyroidism, hyperthyroidism,,lymphadenopathy Chest : Shortness of breath, history of COPD, Asthma, cough Heart : Chest pain, history of coronary arterey disease GI:  Nausea, vomiting, diarrhea, constipation, GERD GU: Dysuria, urgency, frequency of urination, hematuria Neuro: Stroke, seizures, syncope Psych: Depression, anxiety, hallucinations   Physical Exam: Blood pressure 102/45, pulse 71, temperature 97.9 F (36.6 C), temperature source Oral, resp. rate 14, SpO2 95.00%. Constitutional:   Patient is a well-developed and well-nourished female in no acute distress and cooperative with exam. Head: Normocephalic and atraumatic Mouth: Mucus membranes moist Eyes: PERRL, EOMI, conjunctivae normal Neck: Supple, No Thyromegaly Cardiovascular: RRR, S1 normal, S2 normal Pulmonary/Chest: Bilateral rhonchi Abdominal: Soft. Non-tender, non-distended, bowel sounds are normal, no masses, organomegaly, or guarding present.  Neurological: A&O x3, Strenght is normal and symmetric bilaterally, cranial nerve II-XII are grossly intact, no focal motor deficit, sensory  intact to light touch bilaterally.  Extremities : Bilateral 1+ edema of the lower extremities   Labs on Admission:  Results for orders placed during the hospital encounter of 03/23/13 (from the past 48 hour(s))  URINALYSIS W MICROSCOPIC + REFLEX CULTURE     Status:  Abnormal   Collection Time    03/23/13  1:20 PM      Result Value Range   Color, Urine AMBER (*) YELLOW   Comment: BIOCHEMICALS MAY BE AFFECTED BY COLOR   APPearance CLOUDY (*) CLEAR   Specific Gravity, Urine 1.016  1.005 - 1.030   pH 5.0  5.0 - 8.0   Glucose, UA NEGATIVE  NEGATIVE mg/dL   Hgb urine dipstick NEGATIVE  NEGATIVE   Bilirubin Urine SMALL (*) NEGATIVE   Ketones, ur NEGATIVE  NEGATIVE mg/dL   Protein, ur NEGATIVE  NEGATIVE mg/dL   Urobilinogen, UA 1.0  0.0 - 1.0 mg/dL   Nitrite POSITIVE (*) NEGATIVE   Leukocytes, UA LARGE (*) NEGATIVE   WBC, UA 7-10  <3 WBC/hpf   Bacteria, UA MANY (*) RARE   Squamous Epithelial / LPF MANY (*) RARE   Casts HYALINE CASTS (*) NEGATIVE  CBC WITH DIFFERENTIAL     Status: Abnormal   Collection Time    03/23/13  1:48 PM      Result Value Range   WBC 9.2  4.0 - 10.5 K/uL   RBC 4.52  3.87 - 5.11 MIL/uL   Hemoglobin 11.4 (*) 12.0 - 15.0 g/dL   HCT 47.8 (*) 29.5 - 62.1 %   MCV 77.9 (*) 78.0 - 100.0 fL   MCH 25.2 (*) 26.0 - 34.0 pg   MCHC 32.4  30.0 - 36.0 g/dL   RDW 30.8 (*) 65.7 - 84.6 %   Platelets 109 (*) 150 - 400 K/uL   Comment: PLATELET COUNT CONFIRMED BY SMEAR   Neutrophils Relative % 87 (*) 43 - 77 %   Neutro Abs 8.0 (*) 1.7 - 7.7 K/uL   Lymphocytes Relative 6 (*) 12 - 46 %   Lymphs Abs 0.6 (*) 0.7 - 4.0 K/uL   Monocytes Relative 7  3 - 12 %   Monocytes Absolute 0.6  0.1 - 1.0 K/uL   Eosinophils Relative 0  0 - 5 %   Eosinophils Absolute 0.0  0.0 - 0.7 K/uL   Basophils Relative 0  0 - 1 %   Basophils Absolute 0.0  0.0 - 0.1 K/uL  BASIC METABOLIC PANEL     Status: Abnormal   Collection Time    03/23/13  1:48 PM      Result Value Range   Sodium 136  135 - 145 mEq/L   Potassium 3.2 (*) 3.5 - 5.1 mEq/L   Chloride 93 (*) 96 - 112 mEq/L   CO2 32  19 - 32 mEq/L   Glucose, Bld 82  70 - 99 mg/dL   BUN 20  6 - 23 mg/dL   Creatinine, Ser 9.62  0.50 - 1.10 mg/dL   Calcium 9.0  8.4 - 95.2 mg/dL   GFR calc non Af Amer 77 (*) >90  mL/min   GFR calc Af Amer 89 (*) >90 mL/min   Comment: (NOTE)     The eGFR has been calculated using the CKD EPI equation.     This calculation has not been validated in all clinical situations.     eGFR's persistently <90 mL/min signify possible Chronic Kidney     Disease.  PRO B NATRIURETIC PEPTIDE  Status: Abnormal   Collection Time    03/23/13  1:48 PM      Result Value Range   Pro B Natriuretic peptide (BNP) 488.0 (*) 0 - 450 pg/mL  TROPONIN I     Status: None   Collection Time    03/23/13  1:48 PM      Result Value Range   Troponin I <0.30  <0.30 ng/mL   Comment:            Due to the release kinetics of cTnI,     a negative result within the first hours     of the onset of symptoms does not rule out     myocardial infarction with certainty.     If myocardial infarction is still suspected,     repeat the test at appropriate intervals.  GLUCOSE, CAPILLARY     Status: None   Collection Time    03/23/13  1:53 PM      Result Value Range   Glucose-Capillary 82  70 - 99 mg/dL    Radiological Exams on Admission: Dg Chest 2 View  03/23/2013   CLINICAL DATA:  Coughing and congestion and dyspnea with history of diabetes and hypertension  EXAM: CHEST  2 VIEW  COMPARISON:  PA and lateral chest x-ray of June 24, 2012  FINDINGS: The lungs are reasonably well inflated. The interstitial markings are mildly increased. The cardiopericardial silhouette is enlarged. The pulmonary vascularity is engorged. There is no significant pleural effusion. 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 There are degenerative disc changes at multiple thoracic levels.  IMPRESSION: The findings are consistent with congestive heart failure with pulmonary vascular congestion and mild interstitial edema. There is no focal pneumonia.   Electronically Signed   By: David  [place]   On: 03/23/2013 12:39    Assessment/Plan Principal Problem:   Acute bronchitis Active Problems:   Elevated brain natriuretic  peptide (BNP) level   Diabetes mellitus  UTI  Acute bronchitis- patient clinically appears to have acute bronchitis. We'll start the patient on Solu-Medrol 40 mg IV every 6 hours along with DuoNeb nebulizers. Patient will be continued on antibiotics. We'll start Levaquin from tomorrow morning.  ? Diastolic heart failure-patient has elevated BNP and question of interstitial edema on the chest x-ray. We will obtain 2-D echo and monitor the cardiac enzymes. Her EKG shows sinus rhythm with left axis deviation and bifascicular block. We'll continue the patient on Lasix 40 mg by mouth twice a day. Will avoid IV Lasix due to soft blood pressure.  Hypokalemia- replace potassium with IV KCl 10 mg x3. Check BMP in the morning  UTI- will obtain urine culture and continue antibiotics.  Diabetes mellitus- will hold Amaryl and start sliding scale insulin with Humalog sliding scale.  DVT prophylaxis- Lovenox  Code status: Patient is DO NOT RESUSCITATE  Family discussion: Discussed with patient's sister at bedside   Time Spent on Admission: 65 min  [facility] Pager: [phone] 03/23/2013, 4:43 PM  If 7PM-7AM, please contact night-coverage  [email or web address]  Password [email or web address]

## 2013-03-23 NOTE — ED Notes (Signed)
Family at bedside. 

## 2013-03-23 NOTE — ED Notes (Signed)
Pt c/o cough and congestion from URI x 1 week that is not improving

## 2013-03-23 NOTE — ED Notes (Signed)
The patient ambulated with 2x assist and the gait was unsteady and became tired.The patient had no complaints. The tech reported to RN in charge.

## 2013-03-24 ENCOUNTER — Encounter (HOSPITAL_COMMUNITY): Payer: Self-pay | Admitting: *Deleted

## 2013-03-24 DIAGNOSIS — I059 Rheumatic mitral valve disease, unspecified: Secondary | ICD-10-CM

## 2013-03-24 DIAGNOSIS — J9801 Acute bronchospasm: Secondary | ICD-10-CM

## 2013-03-24 LAB — COMPREHENSIVE METABOLIC PANEL
ALT: 33 U/L (ref 0–35)
Alkaline Phosphatase: 274 U/L — ABNORMAL HIGH (ref 39–117)
BUN: 20 mg/dL (ref 6–23)
Calcium: 8.7 mg/dL (ref 8.4–10.5)
Chloride: 93 mEq/L — ABNORMAL LOW (ref 96–112)
GFR calc Af Amer: 79 mL/min — ABNORMAL LOW (ref >90)
Glucose, Bld: 123 mg/dL — ABNORMAL HIGH (ref 70–99)
Potassium: 4 mEq/L (ref 3.5–5.1)
Sodium: 134 mEq/L — ABNORMAL LOW (ref 135–145)
Total Bilirubin: 0.4 mg/dL (ref 0.3–1.2)
Total Protein: 6.6 g/dL (ref 6.0–8.3)

## 2013-03-24 LAB — TROPONIN I
Troponin I: <0.3 ng/mL (ref <0.30)
Troponin I: <0.3 ng/mL (ref <0.30)

## 2013-03-24 LAB — GLUCOSE, CAPILLARY: Glucose-Capillary: 117 mg/dL — ABNORMAL HIGH (ref 70–99)

## 2013-03-24 LAB — CBC
HCT: 32.8 % — ABNORMAL LOW (ref 36.0–46.0)
Hemoglobin: 10.5 g/dL — ABNORMAL LOW (ref 12.0–15.0)
MCHC: 32 g/dL (ref 30.0–36.0)
RBC: 4.18 MIL/uL (ref 3.87–5.11)
WBC: 8 10*3/uL (ref 4.0–10.5)

## 2013-03-24 LAB — HEMOGLOBIN A1C: Mean Plasma Glucose: 154 mg/dL — ABNORMAL HIGH (ref <117)

## 2013-03-24 MED ORDER — PREDNISONE 50 MG PO TABS
60.0000 mg | ORAL_TABLET | Freq: Two times a day (BID) | ORAL | Status: DC
  Administered 2013-03-24 – 2013-03-25 (×2): 60 mg via ORAL
  Filled 2013-03-24 (×4): qty 1

## 2013-03-24 MED ORDER — LEVOFLOXACIN 500 MG PO TABS
500.0000 mg | ORAL_TABLET | Freq: Every day | ORAL | Status: DC
  Administered 2013-03-24 – 2013-03-25 (×2): 500 mg via ORAL
  Filled 2013-03-24 (×3): qty 1

## 2013-03-24 MED ORDER — DM-GUAIFENESIN ER 30-600 MG PO TB12
1.0000 | ORAL_TABLET | Freq: Two times a day (BID) | ORAL | Status: DC
  Administered 2013-03-24 – 2013-03-26 (×5): 1 via ORAL
  Filled 2013-03-24 (×6): qty 1

## 2013-03-24 MED ORDER — ALBUTEROL SULFATE (2.5 MG/3ML) 0.083% IN NEBU
2.5000 mg | INHALATION_SOLUTION | Freq: Four times a day (QID) | RESPIRATORY_TRACT | Status: DC
  Administered 2013-03-24 – 2013-03-25 (×3): 2.5 mg via RESPIRATORY_TRACT
  Filled 2013-03-24 (×7): qty 3

## 2013-03-24 NOTE — Progress Notes (Signed)
Echo Lab  2D Echocardiogram completed.  Deborah Watkins, RDCS 03/24/2013 3:30 PM

## 2013-03-24 NOTE — Evaluation (Signed)
Physical Therapy Evaluation Patient Details Name: Deborah Watkins MRN: [id number] DOB: [birth year] Today's Date: 03/24/2013 Time: 4098-1191 PT Time Calculation (min): 18 min  PT Assessment / Plan / Recommendation History of Present Illness  79 y.o. female admitted to [facility] on 03/23/13 with cough/SOB.  Dx with bronchitis.  Of note, pt lives with her sister and is mentally handicapped.   Clinical Impression  Per niece's report this is pretty close to pt's baseline of mobility.  The exception is that she has much increased WOB with DOE 3/4 with gait.  O2 sats and HR remained stable on RA.   The pt has excellent family support and niece is going to check with the rest of the family, but does not believe they are interested in HHPT services f/u at d/c.  She is agreeable to have me follow the pt while in the hospital.   PT to follow acutely for deficits listed below.       PT Assessment  Patient needs continued PT services    Follow Up Recommendations  No PT follow up;Supervision/Assistance - 24 hour    Does the patient have the potential to tolerate intense rehabilitation     NA  Barriers to Discharge   None      Equipment Recommendations  None recommended by PT    Recommendations for Other Services   None  Frequency Min 3X/week    Precautions / Restrictions Precautions Precautions: Fall   Pertinent Vitals/Pain HR in the 80s O2 sats >96% on RA despite DOE 3/4 with gait and several episodes of productive cough.       Mobility  Bed Mobility Bed Mobility: Supine to Sit;Sitting - Scoot to [place] of Bed;Sit to Supine Supine to Sit: HOB elevated;1: +2 Total assist Supine to Sit: Patient Percentage: 60% Sitting - Scoot to Edge of Bed: 1: +2 Total assist Sitting - Scoot to Edge of Bed: Patient Percentage: 60% Sit to Supine: 3: Mod assist;With rail;HOB flat Details for Bed Mobility Assistance: two person assist to get to sitting due to maximal posterior lean during transitions.  Pt  using bil hand held assist to get to sitting.  Mod assist to help lift both legs to get back supine in the bed.  Transfers Transfers: Sit to Stand;Stand to Sit Sit to Stand: 1: +2 Total assist;With upper extremity assist;From bed;From toilet Sit to Stand: Patient Percentage: 60% Stand to Sit: 1: +2 Total assist;Without upper extremity assist;To bed;To toilet Stand to Sit: Patient Percentage: 70% Details for Transfer Assistance: pt required bil hand held assist to get OOB and off of toilet after using the bathroom to support trunk and make sure that her legs were under her (she tends to extend both knees and lean posteriorly during sit to stand transitions).  Once standing, anterior translation of trunk needed to be established before she could initiate steps to start walking.  Ambulation/Gait Ambulation/Gait Assistance: 1: +2 Total assist Ambulation/Gait: Patient Percentage: 70% Ambulation Distance (Feet): 15 Feet Assistive device: 2 person hand held assist Ambulation/Gait Assistance Details: two person hand held assist to steady pt for balance during gait.  Pt had h/o bad bil kneeds and is very unsteady on her feet.  Still mild posterior lean once standing and ambulating forward, especially when trying to back up to commode or back up to bed to sit down.  Gait Pattern: Step-through pattern;Shuffle;Narrow base of support Gait velocity: decreased General Gait Details: Significant increase in DOE with gait 3/4, but O2 sats and HR remained  stable on RA (80s HR, O2 sats 96 or higher).         PT Diagnosis: Difficulty walking;Abnormality of gait;Generalized weakness;Altered mental status  PT Problem List: Decreased strength;Decreased activity tolerance;Decreased balance;Decreased mobility;Decreased cognition;Decreased safety awareness;Decreased knowledge of precautions;Obesity PT Treatment Interventions: DME instruction;Gait training;Stair training;Functional mobility training;Therapeutic  activities;Therapeutic exercise;Balance training;Neuromuscular re-education;Patient/family education     PT Goals(Current goals can be found in the care plan section) Acute Rehab PT Goals Patient Stated Goal: family would like for her to go home with them tomorrow or the next day PT Goal Formulation: With patient/family Time For Goal Achievement: 04/07/13 Potential to Achieve Goals: Good  Visit Information  Last PT Received On: 03/24/13 Assistance Needed: +2 History of Present Illness: 79 y.o. female admitted to [facility] on 03/23/13 with cough/SOB.  Dx with bronchitis.  Of note, pt lives with her sister and is mentally handicapped.        Prior Functioning  Home Living Family/patient expects to be discharged to:: Private residence Living Arrangements: Other relatives Available Help at Discharge: Other (Comment);Available 24 hours/day (sister, niece) Additional Comments: Per niece, pt only ambulated ~30' max PTA and always with assist.   Prior Function Level of Independence: Needs assistance Gait / Transfers Assistance Needed: min to mod assist, hand held assist.  ADL's / Homemaking Assistance Needed: total assist Communication / Swallowing Assistance Needed: communication limted by her mental capacity, no needs reoported as far as eating and swallowing.  Communication Communication: Other (comment) (limited verbalization. )    Cognition  Cognition Arousal/Alertness: Awake/alert Behavior During Therapy: WFL for tasks assessed/performed Overall Cognitive Status: History of cognitive impairments - at baseline    Extremity/Trunk Assessment Upper Extremity Assessment Upper Extremity Assessment: RUE deficits/detail RUE Deficits / Details: per niece, pt has h/o R shoulder surgery Lower Extremity Assessment Lower Extremity Assessment: Generalized weakness Cervical / Trunk Assessment Cervical / Trunk Assessment: Normal   Balance Balance Balance Assessed: Yes Static Sitting  Balance Static Sitting - Balance Support: Bilateral upper extremity supported;Feet supported Static Sitting - Level of Assistance: 3: Mod assist Static Sitting - Comment/# of Minutes: mod assist to prevent posterior LOB in sitting EOB.f  Static Standing Balance Static Standing - Balance Support: Bilateral upper extremity supported Static Standing - Level of Assistance: 1: +2 Total assist;Patient percentage (comment) (pt 70%) Dynamic Standing Balance Dynamic Standing - Balance Support: Bilateral upper extremity supported Dynamic Standing - Level of Assistance: 1: +2 Total assist;Patient percentage (comment) (pt 70%)  End of Session PT - End of Session Activity Tolerance: Patient limited by fatigue;Other (comment) (limited by DOE) Patient left: in bed;with call bell/phone within reach;with bed alarm set;with family/visitor present   Lurena Joiner B. Makaela Cando, PT, DPT [phone]   03/24/2013, 5:05 PM

## 2013-03-24 NOTE — Care Management Note (Signed)
Page 1 of 2   03/28/2013     4:53:51 PM   CARE MANAGEMENT NOTE 03/28/2013  Patient:  Deborah Watkins, Deborah Watkins   Account Number:  [id number]  Date Initiated:  03/24/2013  Documentation initiated by:  GRAVES-BIGELOW,BRENDA  Subjective/Objective Assessment:   Pt admitted for cough. Pt is from home with sister.     Action/Plan:   CM will continue to monitor for disposition needs.   Anticipated DC Date:  03/29/2013   Anticipated DC Plan:  HOME W HOSPICE CARE      DC Planning Services  CM consult      [facility] Choice  HOME HEALTH  HOSPICE   Choice offered to / List presented to:  C-1 Patient   DME arranged  HOSPITAL BED  OXYGEN      DME agency  [facility]     [facility] arranged  HH-1 RN  HH-10 DISEASE MANAGEMENT  HH-5 SPEECH THERAPY      HH agency  [facility]  [facility] [place]   Status of service:  Completed, signed off Medicare Important Message given?   (If response is "NO", the following Medicare IM given date fields will be blank) Date Medicare IM given:   Date Additional Medicare IM given:    Discharge Disposition:  HOME W HOME HEALTH SERVICES  Per UR Regulation:  Reviewed for med. necessity/level of care/duration of stay  If discussed at Long Length of Stay Meetings, dates discussed:    Comments:  03/28/13- 1620- Donn Pierini RN, BSN [phone] Per Delray Alt with [facility]- they will not be able to have someone out to the home before Sun 03/30/13- and they are still waiting to hear back from pt's PCP. Per [facility] it would be best to d/c pt home in am- to allow them to finalize things and get DME to the home- pt's sister was hopeful for d/c today but is ok with d/c in am and with services to begin on Sunday with Hospice. After speaking with Margie with [facility] - recommendations were made for Hospice DME packages B and D (with full side rails for bed and 02 at 2L) ? for MD is if pt will need nebs at home. Call made to [place] with [facility] for DME  needs- equipment will be delivered to home prior to d/c home and 02 tank will be brought to home for d/c. Pt's sister aware of plan for d/c in am- and Dr. Lendell Caprice paged will plan for d/c in am with Hospice services to begin on  Sunday-03/30/13  03/28/13- 1200- Donn Pierini RN, BSN [phone] Per Dr. Lendell Caprice plan now is home with Hospice- pt ready for d/c whenever arrangements can be made for Hospice- referral for Hospice in Epic- in to speak with pt, sister and brother along with sister-n-law at bedside- list of Hospice agencies for [facility] given to family- per choice they would like to use [facility]- per conversation with family- plan is for pt to return home with sister- Gillis Ends- family plan to transport pt home via private vehicle- DME already in home includes walker, BSC, and w/c- at this time family does not want a hospital bed and feel like pt will not wear home 02 so want to f/u with hospice on that after pt gets home. PCP is Lupita Raider.  Referral called to [facility] with [facility]- awaiting return call back for possible d/c day for when Hospice services can start- Notified [facility] of new d/c plan  03-26-13 1432 Tomi Bamberger, RN,BSN [phone] CM did speak to family and they are agreeable to [facility] services via [facility]. CM did make referral and SOC to begin within 24-48 hours post d/c.

## 2013-03-24 NOTE — Progress Notes (Addendum)
Chart reviewed.   [facility] PROGRESS NOTE  Deborah Watkins GMW:[id number] DOB: [birth year] DOA: 03/23/2013 PCP: Lupita Raider, MD  Assessment/Plan:  Principal Problem:   Acute bronchitis Active Problems:   Elevated brain natriuretic peptide (BNP) level   Diabetes mellitus Wheezing MR Hypokalemia resolved  Still with moderate wheeze. Continue antibiotics, HHN, steroids.  Await echo. Flu swab negative  Code Status:  full Family Communication:  D/w sister at bedside Disposition Plan:  home   HPI/Subjective:  No complaints  Objective: Filed Vitals:   03/24/13 1327  BP: 136/61  Pulse: 94  Temp: 98.3 F (36.8 C)  Resp: 18    Intake/Output Summary (Last 24 hours) at 03/24/13 1405 Last data filed at 03/24/13 0049  Gross per 24 hour  Intake    622 ml  Output      0 ml  Net    622 ml   Filed Weights   03/23/13 2021  Weight: 77.1 kg (169 lb 15.6 oz)    Exam:   General:  Asleep arousable. Moderate respiratory distress with minimal exertion  Cardiovascular: RRR without MGR  Respiratory: bilateral wheeze. Prolonged exp phase  Abdomen: obese, s, nt, nd  Ext: no CCE  Basic Metabolic Panel:  Recent Labs Lab 03/23/13 1348 03/24/13 0300  NA 136 134*  K 3.2* 4.0  CL 93* 93*  CO2 32 31  GLUCOSE 82 123*  BUN 20 20  CREATININE 0.79 0.80  CALCIUM 9.0 8.7   Liver Function Tests:  Recent Labs Lab 03/24/13 0300  AST 57*  ALT 33  ALKPHOS 274*  BILITOT 0.4  PROT 6.6  ALBUMIN 2.8*   No results found for this basename: LIPASE, AMYLASE,  in the last 168 hours No results found for this basename: AMMONIA,  in the last 168 hours CBC:  Recent Labs Lab 03/23/13 1348 03/24/13 0300  WBC 9.2 8.0  NEUTROABS 8.0*  --   HGB 11.4* 10.5*  HCT 35.2* 32.8*  MCV 77.9* 78.5  PLT 109* 92*   Cardiac Enzymes:  Recent Labs Lab 03/23/13 1348 03/23/13 2210 03/24/13 0300 03/24/13 0910  TROPONINI <0.30 <0.30 <0.30 <0.30   BNP (last 3  results)  Recent Labs  03/23/13 1348  PROBNP 488.0*   CBG:  Recent Labs Lab 03/23/13 1353 03/24/13 0747 03/24/13 1133  GLUCAP 82 117* 148*    Recent Results (from the past 240 hour(s))  URINE CULTURE     Status: None   Collection Time    03/23/13  1:20 PM      Result Value Range Status   Specimen Description URINE, RANDOM   Final   Special Requests NONE   Final   Culture  Setup Time     Final   Value: 03/23/2013 20:01     Performed at [facility] Count     Final   Value: >=100,000 COLONIES/ML     Performed at [facility]   Culture     Final   Value: ESCHERICHIA COLI     Performed at [facility]   Report Status PENDING   Incomplete     Studies: Dg Chest 2 View  03/23/2013   CLINICAL DATA:  Coughing and congestion and dyspnea with history of diabetes and hypertension  EXAM: CHEST  2 VIEW  COMPARISON:  PA and lateral chest x-ray of June 24, 2012  FINDINGS: The lungs are reasonably well inflated. The interstitial markings are mildly increased. The cardiopericardial silhouette is enlarged. The pulmonary vascularity  is engorged. There is no significant pleural effusion. 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 There are degenerative disc changes at multiple thoracic levels.  IMPRESSION: The findings are consistent with congestive heart failure with pulmonary vascular congestion and mild interstitial edema. There is no focal pneumonia.   Electronically Signed   By: David  [place]   On: 03/23/2013 12:39    Scheduled Meds: . albuterol  2.5 mg Nebulization QID  . aspirin EC  81 mg Oral Daily  . atorvastatin  10 mg Oral QHS  . dextromethorphan-guaiFENesin  1 tablet Oral BID  . enoxaparin (LOVENOX) injection  40 mg Subcutaneous Q24H  . furosemide  40 mg Oral BID  . insulin aspart  0-9 Units Subcutaneous TID WC  . levofloxacin  500 mg Oral Daily  . predniSONE  60 mg Oral BID WC  . sertraline  100 mg Oral q morning - 10a  . sodium chloride   3 mL Intravenous Q12H   Continuous Infusions:   Time spent: 35 minutes  Taniela Feltus L  [facility] Pager [phone]. If 7PM-7AM, please contact night-coverage at [email or web address], password [facility] 03/24/2013, 2:05 PM  LOS: 1 day

## 2013-03-24 NOTE — Progress Notes (Signed)
UR Completed Harlene Petralia Graves-Bigelow, RN,BSN [phone]  

## 2013-03-25 ENCOUNTER — Inpatient Hospital Stay (HOSPITAL_COMMUNITY): Payer: Medicare Other

## 2013-03-25 DIAGNOSIS — F79 Unspecified intellectual disabilities: Secondary | ICD-10-CM | POA: Diagnosis present

## 2013-03-25 LAB — GLUCOSE, CAPILLARY
Glucose-Capillary: 171 mg/dL — ABNORMAL HIGH (ref 70–99)
Glucose-Capillary: 242 mg/dL — ABNORMAL HIGH (ref 70–99)
Glucose-Capillary: 248 mg/dL — ABNORMAL HIGH (ref 70–99)

## 2013-03-25 LAB — CBC WITH DIFFERENTIAL/PLATELET
Basophils Absolute: 0 10*3/uL (ref 0.0–0.1)
Basophils Relative: 0 % (ref 0–1)
Eosinophils Absolute: 0 10*3/uL (ref 0.0–0.7)
Eosinophils Relative: 0 % (ref 0–5)
HCT: 31.4 % — ABNORMAL LOW (ref 36.0–46.0)
Hemoglobin: 10.1 g/dL — ABNORMAL LOW (ref 12.0–15.0)
Lymphocytes Relative: 4 % — ABNORMAL LOW (ref 12–46)
Lymphs Abs: 0.5 10*3/uL — ABNORMAL LOW (ref 0.7–4.0)
MCH: 25.1 pg — ABNORMAL LOW (ref 26.0–34.0)
MCHC: 32.2 g/dL (ref 30.0–36.0)
MCV: 77.9 fL — ABNORMAL LOW (ref 78.0–100.0)
Monocytes Absolute: 0.6 10*3/uL (ref 0.1–1.0)
Monocytes Relative: 5 % (ref 3–12)
Neutro Abs: 11.8 10*3/uL — ABNORMAL HIGH (ref 1.7–7.7)
Neutrophils Relative %: 92 % — ABNORMAL HIGH (ref 43–77)
Platelets: 128 10*3/uL — ABNORMAL LOW (ref 150–400)
RBC: 4.03 MIL/uL (ref 3.87–5.11)
RDW: 16.5 % — ABNORMAL HIGH (ref 11.5–15.5)
WBC: 12.9 10*3/uL — ABNORMAL HIGH (ref 4.0–10.5)

## 2013-03-25 LAB — COMPREHENSIVE METABOLIC PANEL WITH GFR
ALT: 60 U/L — ABNORMAL HIGH (ref 0–35)
AST: 115 U/L — ABNORMAL HIGH (ref 0–37)
Albumin: 2.8 g/dL — ABNORMAL LOW (ref 3.5–5.2)
Alkaline Phosphatase: 263 U/L — ABNORMAL HIGH (ref 39–117)
BUN: 27 mg/dL — ABNORMAL HIGH (ref 6–23)
CO2: 27 meq/L (ref 19–32)
Calcium: 8.1 mg/dL — ABNORMAL LOW (ref 8.4–10.5)
Chloride: 95 meq/L — ABNORMAL LOW (ref 96–112)
Creatinine, Ser: 0.88 mg/dL (ref 0.50–1.10)
GFR calc Af Amer: 70 mL/min — ABNORMAL LOW
GFR calc non Af Amer: 60 mL/min — ABNORMAL LOW
Glucose, Bld: 262 mg/dL — ABNORMAL HIGH (ref 70–99)
Potassium: 3.4 meq/L — ABNORMAL LOW (ref 3.7–5.3)
Sodium: 139 meq/L (ref 137–147)
Total Bilirubin: 0.4 mg/dL (ref 0.3–1.2)
Total Protein: 6.5 g/dL (ref 6.0–8.3)

## 2013-03-25 LAB — URINE CULTURE

## 2013-03-25 LAB — PRO B NATRIURETIC PEPTIDE: Pro B Natriuretic peptide (BNP): 914.3 pg/mL — ABNORMAL HIGH (ref 0–450)

## 2013-03-25 MED ORDER — FUROSEMIDE 10 MG/ML IJ SOLN
40.0000 mg | Freq: Once | INTRAMUSCULAR | Status: AC
  Administered 2013-03-25: 40 mg via INTRAVENOUS
  Filled 2013-03-25: qty 4

## 2013-03-25 MED ORDER — HYDROCHLOROTHIAZIDE 25 MG PO TABS
25.0000 mg | ORAL_TABLET | Freq: Every morning | ORAL | Status: DC
  Administered 2013-03-25 – 2013-03-29 (×5): 25 mg via ORAL
  Filled 2013-03-25 (×5): qty 1

## 2013-03-25 MED ORDER — FUROSEMIDE 10 MG/ML IJ SOLN
40.0000 mg | Freq: Two times a day (BID) | INTRAMUSCULAR | Status: DC
  Administered 2013-03-26 (×2): 40 mg via INTRAVENOUS
  Filled 2013-03-25 (×5): qty 4

## 2013-03-25 MED ORDER — ALBUTEROL SULFATE (2.5 MG/3ML) 0.083% IN NEBU
2.5000 mg | INHALATION_SOLUTION | Freq: Four times a day (QID) | RESPIRATORY_TRACT | Status: DC
  Administered 2013-03-25 – 2013-03-27 (×7): 2.5 mg via RESPIRATORY_TRACT
  Filled 2013-03-25 (×16): qty 3

## 2013-03-25 MED ORDER — GLIMEPIRIDE 1 MG PO TABS
1.0000 mg | ORAL_TABLET | Freq: Every day | ORAL | Status: DC
  Administered 2013-03-26 – 2013-03-29 (×4): 1 mg via ORAL
  Filled 2013-03-25 (×5): qty 1

## 2013-03-25 MED ORDER — INSULIN ASPART 100 UNIT/ML ~~LOC~~ SOLN
0.0000 [IU] | Freq: Every day | SUBCUTANEOUS | Status: DC
  Administered 2013-03-26 – 2013-03-28 (×2): 2 [IU] via SUBCUTANEOUS

## 2013-03-25 MED ORDER — METHYLPREDNISOLONE SODIUM SUCC 125 MG IJ SOLR
125.0000 mg | Freq: Four times a day (QID) | INTRAMUSCULAR | Status: DC
  Administered 2013-03-25 – 2013-03-26 (×4): 125 mg via INTRAVENOUS
  Filled 2013-03-25 (×7): qty 2

## 2013-03-25 MED ORDER — ALBUTEROL SULFATE (2.5 MG/3ML) 0.083% IN NEBU
2.5000 mg | INHALATION_SOLUTION | RESPIRATORY_TRACT | Status: DC | PRN
  Administered 2013-03-25 – 2013-03-28 (×3): 2.5 mg via RESPIRATORY_TRACT
  Filled 2013-03-25 (×5): qty 3

## 2013-03-25 MED ORDER — FUROSEMIDE 40 MG PO TABS
40.0000 mg | ORAL_TABLET | Freq: Every day | ORAL | Status: DC
  Filled 2013-03-25 (×2): qty 1

## 2013-03-25 MED ORDER — INSULIN ASPART 100 UNIT/ML ~~LOC~~ SOLN
0.0000 [IU] | Freq: Three times a day (TID) | SUBCUTANEOUS | Status: DC
  Administered 2013-03-25: 7 [IU] via SUBCUTANEOUS
  Administered 2013-03-26 – 2013-03-27 (×4): 4 [IU] via SUBCUTANEOUS
  Administered 2013-03-27: 11 [IU] via SUBCUTANEOUS
  Administered 2013-03-27: 4 [IU] via SUBCUTANEOUS
  Administered 2013-03-28: 15 [IU] via SUBCUTANEOUS
  Administered 2013-03-28 (×2): 7 [IU] via SUBCUTANEOUS
  Administered 2013-03-29: 11 [IU] via SUBCUTANEOUS
  Administered 2013-03-29: 3 [IU] via SUBCUTANEOUS

## 2013-03-25 MED ORDER — POTASSIUM CHLORIDE 10 MEQ/100ML IV SOLN
10.0000 meq | INTRAVENOUS | Status: AC
  Administered 2013-03-25 (×2): 10 meq via INTRAVENOUS
  Filled 2013-03-25 (×2): qty 100

## 2013-03-25 NOTE — Progress Notes (Addendum)
[facility] PROGRESS NOTE  Deborah Watkins WJX:[id number] DOB: May 23, 1930 DOA: 03/23/2013 PCP: Lupita Raider, MD  Assessment/Plan:  Principal Problem:   Acute bronchitis Active Problems:   Elevated brain natriuretic peptide (BNP) level: Echocardiogram shows preserved ejection fraction. Unable to evaluate diastolic function   Diabetes mellitus: Will increase sliding scale 2 resistant and at nighttime coverage as she'll be on higher dose of steroid. Resume Amaryl.   Bronchospasm, acute: Worse.  Accessory muscle use. Will repeat chest x-ray stat. give dose of IV Lasix. Increase steroids and changed to IV. May need to broaden antibiotics. Repeat proBNP. MR Hypokalemia resolved Escherichia coli UTI: Sensitive to Levaquin. Hypertension: Resume hydrochlorothiazide.  Also, relative reports that patient sometimes has difficulty swallowing. May have silent aspiration. We'll consult is therapy.  Looks worse today.   Code Status:  full Family Communication:  D/w sister at bedside Disposition Plan:  home   HPI/Subjective:  No complaints  Objective: Filed Vitals:   03/25/13 1357  BP: 136/40  Pulse: 82  Temp: 98 F (36.7 C)  Resp: 18   No intake or output data in the 24 hours ending 03/25/13 1449 Filed Weights   03/23/13 2021  Weight: 77.1 kg (169 lb 15.6 oz)    Exam:   General:  Awake. Mild to moderately increased work of breathing  HEENT:  Dry mucous membranes  Cardiovascular: RRR without MGR  Respiratory: bilateral wheeze. Rhonchi. Prolonged exp phase. More labored today.  Abdomen: obese, s, nt, nd  Ext: no CCE  Basic Metabolic Panel:  Recent Labs Lab 03/23/13 1348 03/24/13 0300  NA 136 134*  K 3.2* 4.0  CL 93* 93*  CO2 32 31  GLUCOSE 82 123*  BUN 20 20  CREATININE 0.79 0.80  CALCIUM 9.0 8.7   Liver Function Tests:  Recent Labs Lab 03/24/13 0300  AST 57*  ALT 33  ALKPHOS 274*  BILITOT 0.4  PROT 6.6  ALBUMIN 2.8*   No results found  for this basename: LIPASE, AMYLASE,  in the last 168 hours No results found for this basename: AMMONIA,  in the last 168 hours CBC:  Recent Labs Lab 03/23/13 1348 03/24/13 0300  WBC 9.2 8.0  NEUTROABS 8.0*  --   HGB 11.4* 10.5*  HCT 35.2* 32.8*  MCV 77.9* 78.5  PLT 109* 92*   Cardiac Enzymes:  Recent Labs Lab 03/23/13 1348 03/23/13 2210 03/24/13 0300 03/24/13 0910  TROPONINI <0.30 <0.30 <0.30 <0.30   BNP (last 3 results)  Recent Labs  03/23/13 1348  PROBNP 488.0*   CBG:  Recent Labs Lab 03/24/13 0747 03/24/13 1133 03/24/13 1629 03/25/13 0738 03/25/13 1145  GLUCAP 117* 148* 190* 171* 242*    Recent Results (from the past 240 hour(s))  URINE CULTURE     Status: None   Collection Time    03/23/13  1:20 PM      Result Value Range Status   Specimen Description URINE, RANDOM   Final   Special Requests NONE   Final   Culture  Setup Time     Final   Value: 03/23/2013 20:01     Performed at [facility] Count     Final   Value: >=100,000 COLONIES/ML     Performed at [facility]   Culture     Final   Value: ESCHERICHIA COLI     Performed at [facility]   Report Status 03/25/2013 FINAL   Final   Organism ID, Bacteria ESCHERICHIA COLI  Final     Studies: No results found.  Scheduled Meds: . albuterol  2.5 mg Nebulization QID  . aspirin EC  81 mg Oral Daily  . atorvastatin  10 mg Oral QHS  . dextromethorphan-guaiFENesin  1 tablet Oral BID  . enoxaparin (LOVENOX) injection  40 mg Subcutaneous Q24H  . insulin aspart  0-9 Units Subcutaneous TID WC  . levofloxacin  500 mg Oral Daily  . predniSONE  60 mg Oral BID WC  . sertraline  100 mg Oral q morning - 10a  . sodium chloride  3 mL Intravenous Q12H   Continuous Infusions:   Time spent: 35 minutes  Swayze Kozuch L  [facility] Pager [phone]. If 7PM-7AM, please contact night-coverage at [email or web address], password [facility] 03/25/2013, 2:49 PM  LOS: 2 days

## 2013-03-25 NOTE — Evaluation (Signed)
Clinical/Bedside Swallow Evaluation Patient Details  Name: Deborah Watkins MRN: [id number] Date of Birth: 1930/04/14  Today's Date: 03/25/2013 Time: 4098-1191 SLP Time Calculation (min): 26 min  Past Medical History:  Past Medical History  Diagnosis Date  . Mental retardation   . Hypertension   . Diabetes mellitus   . Arthritis    Past Surgical History: History reviewed. No pertinent past surgical history. HPI:  79 y.o. female admitted to [facility] on 03/23/13 with cough/SOB.  Dx with bronchitis.  Of note, pt lives with her sister and is mentally handicapped.  Sister reports frequent choking at home.   Assessment / Plan / Recommendation Clinical Impression  Pt presents with baseline audible wheezing, congestion, and wet cough. Pt's sister reports frequent choking at home and changes in breathing pattern after taking PO meds earlier today. She also endorses impulsivity with PO consumption. Pt demonstrated intermittent delayed cough throughout PO trials with immediate cough x1 with thin liquids, although swallow appeared timely with adequate hyolaryngeal movement upon palpaiton. Given the above and pt's current respiratory infection, recommend to proceed with objective testing to more accuractely asses aspiration risk. Pt's sister provided thorough education regarding aspiration risks and safe swallowing strategies.    Aspiration Risk  Moderate    Diet Recommendation Dysphagia 2 (Fine chop);Thin liquid   Liquid Administration via: Cup;Straw;No straw Medication Administration: Whole meds with liquid Supervision: Staff to assist with self feeding;Full supervision/cueing for compensatory strategies;Trained caregiver to feed patient Compensations: Slow rate;Small sips/bites Postural Changes and/or Swallow Maneuvers: Seated upright 90 degrees    Other  Recommendations Recommended Consults: MBS Oral Care Recommendations: Oral care BID   Follow Up Recommendations  Other (comment) (TBD  pending objective testing)    Frequency and Duration        Pertinent Vitals/Pain N/A    SLP Swallow Goals  TBD pending objective testing   Swallow Study Prior Functional Status       General Date of Onset:  (chronic per sister) HPI: 79 y.o. female admitted to [facility] on 03/23/13 with cough/SOB.  Dx with bronchitis.  Of note, pt lives with her sister and is mentally handicapped.  Sister reports frequent choking at home. Type of Study: Bedside swallow evaluation Previous Swallow Assessment: none in chart Diet Prior to this Study: Dysphagia 2 (chopped);Thin liquids Temperature Spikes Noted: No Respiratory Status: Nasal cannula (2L) History of Recent Intubation: No Behavior/Cognition: Alert;Cooperative;Pleasant mood;Requires cueing Oral Cavity - Dentition: Dentures, top;Dentures, bottom Self-Feeding Abilities: Needs assist Patient Positioning: Upright in bed Baseline Vocal Quality: Clear;Other (comment) (audible wheezing, congestion) Volitional Cough: Strong    Oral/Motor/Sensory Function     Ice Chips Ice chips: Not tested   Thin Liquid Thin Liquid: Impaired Presentation: Cup;Self Fed;Straw Oral Phase Impairments: Reduced labial seal Pharyngeal  Phase Impairments: Cough - Immediate;Cough - Delayed    Nectar Thick Nectar Thick Liquid: Not tested   Honey Thick Honey Thick Liquid: Not tested   Puree Puree: Impaired Presentation: Spoon Pharyngeal Phase Impairments: Cough - Delayed   Solid   GO    Solid: Impaired Pharyngeal Phase Impairments: Cough - Delayed        Maxcine Ham, M.A. CCC-SLP [phone]  Maxcine Ham 03/25/2013,4:56 PM

## 2013-03-26 ENCOUNTER — Inpatient Hospital Stay (HOSPITAL_COMMUNITY): Payer: Medicare Other

## 2013-03-26 DIAGNOSIS — R1310 Dysphagia, unspecified: Secondary | ICD-10-CM | POA: Diagnosis present

## 2013-03-26 LAB — GLUCOSE, CAPILLARY
Glucose-Capillary: 165 mg/dL — ABNORMAL HIGH (ref 70–99)
Glucose-Capillary: 159 mg/dL — ABNORMAL HIGH (ref 70–99)
Glucose-Capillary: 170 mg/dL — ABNORMAL HIGH (ref 70–99)

## 2013-03-26 MED ORDER — AMOXICILLIN-POT CLAVULANATE 875-125 MG PO TABS
1.0000 | ORAL_TABLET | Freq: Two times a day (BID) | ORAL | Status: DC
  Administered 2013-03-26 – 2013-03-28 (×5): 1 via ORAL
  Filled 2013-03-26 (×6): qty 1

## 2013-03-26 MED ORDER — STARCH (THICKENING) PO POWD
ORAL | Status: DC | PRN
  Filled 2013-03-26: qty 227

## 2013-03-26 MED ORDER — METHYLPREDNISOLONE SODIUM SUCC 125 MG IJ SOLR
60.0000 mg | Freq: Four times a day (QID) | INTRAMUSCULAR | Status: DC
  Administered 2013-03-26 – 2013-03-28 (×8): 60 mg via INTRAVENOUS
  Filled 2013-03-26 (×11): qty 0.96

## 2013-03-26 MED ORDER — FUROSEMIDE 10 MG/ML IJ SOLN
40.0000 mg | Freq: Once | INTRAMUSCULAR | Status: AC
  Administered 2013-03-26: 40 mg via INTRAVENOUS

## 2013-03-26 NOTE — Procedures (Signed)
Objective Swallowing Evaluation: Modified Barium Swallowing Study  Patient Details  Name: Deborah Watkins MRN: [id number] Date of Birth: 1931/02/21  Today's Date: 03/26/2013 Time: 0912-0930 SLP Time Calculation (min): 18 min  Past Medical History:  Past Medical History  Diagnosis Date  . Mental retardation   . Hypertension   . Diabetes mellitus   . Arthritis    Past Surgical History: History reviewed. No pertinent past surgical history. HPI:  79 y.o. female admitted to [facility] on 03/23/13 with cough/SOB.  Dx with bronchitis.  Of note, pt lives with her sister and is mentally handicapped.  Sister reports frequent choking at home.     Assessment / Plan / Recommendation Clinical Impression  Dysphagia Diagnosis: Moderate pharyngeal phase dysphagia Clinical impression: Pt. exhibited moderate pharyngeal dyphagia with both sensory and motor impairments.  Delayed swallow initiation led to frank aspiration just prior to initiating swallow with teaspoon nectar barium.  Pt. not likely to comply with swallow strategies due to cognitive deficits.  She appeared to have C2-3 osteophyte (fusion?) however epiglottis is below this level and inversion is not impaired, however cannot rule out some degree dyscoordination.  Oral prep and mastication with cracker was WFL's.  Brief esophageal scan did not reveal abnormalitites (study does not diagnose below level of UES).  SLP recommends a Dys 3 diet and honey thick liquids with 2 swallows, pills whole in applesauce and full supervision.  Uncertain of dysphagia prognosis without known etiology (likley chronic with question of acute factors?).  ST will follow for safety and continued education with sister.      Treatment Recommendation  Therapy as outlined in treatment plan below    Diet Recommendation Dysphagia 3 (Mechanical Soft);Honey-thick liquid   Liquid Administration via: Cup;No straw Medication Administration: Whole meds with puree Supervision: Patient  able to self feed;Full supervision/cueing for compensatory strategies Compensations: Slow rate;Small sips/bites;Multiple dry swallows after each bite/sip Postural Changes and/or Swallow Maneuvers: Seated upright 90 degrees    Other  Recommendations Oral Care Recommendations: Oral care BID   Follow Up Recommendations  Home health SLP    Frequency and Duration min 2x/week  2 weeks   Pertinent Vitals/Pain WDL           Reason for Referral Objectively evaluate swallowing function   Oral Phase Oral Preparation/Oral Phase Oral Phase: WFL   Pharyngeal Phase Pharyngeal Phase Pharyngeal Phase: Impaired Pharyngeal - Honey Pharyngeal - Honey Teaspoon: Delayed swallow initiation;Premature spillage to valleculae;Pharyngeal residue - valleculae;Pharyngeal residue - pyriform sinuses;Reduced tongue base retraction;Reduced laryngeal elevation Pharyngeal - Honey Cup: Pharyngeal residue - pyriform sinuses;Pharyngeal residue - valleculae;Reduced laryngeal elevation;Reduced tongue base retraction;Delayed swallow initiation;Premature spillage to valleculae Pharyngeal - Nectar Pharyngeal - Nectar Teaspoon: Delayed swallow initiation;Premature spillage to valleculae;Penetration/Aspiration before swallow;Pharyngeal residue - pyriform sinuses;Pharyngeal residue - valleculae;Reduced tongue base retraction;Reduced laryngeal elevation;Reduced airway/laryngeal closure Penetration/Aspiration details (nectar teaspoon): Material enters airway, passes BELOW cords and not ejected out despite cough attempt by patient Pharyngeal - Solids Pharyngeal - Puree: Delayed swallow initiation;Premature spillage to valleculae;Pharyngeal residue - valleculae;Pharyngeal residue - pyriform sinuses;Reduced tongue base retraction;Reduced laryngeal elevation Pharyngeal - Regular: Pharyngeal residue - valleculae;Pharyngeal residue - pyriform sinuses;Reduced tongue base retraction;Reduced laryngeal elevation  Cervical Esophageal  Phase    GO    Cervical Esophageal Phase Cervical Esophageal Phase: Impaired Cervical Esophageal Phase - Comment Cervical Esophageal Comment:  (mild stasis)         Breck Coons Floyed Masoud M.Ed [employer] [phone]  03/26/2013

## 2013-03-26 NOTE — Progress Notes (Signed)
[facility] PROGRESS NOTE  Deborah Watkins ZOX:[id number] DOB: 08/20/1930 DOA: 03/23/2013 PCP: Lupita Raider, MD  Assessment/Plan:  Principal Problem:   Acute bronchitis Active Problems:   Elevated brain natriuretic peptide (BNP) level: Echocardiogram shows preserved ejection fraction. Unable to evaluate diastolic function: continue iv lasix   Diabetes mellitus: continue current   Bronchospasm, acute: better today MR Hypokalemia resolved Escherichia coli UTI: Sensitive to Levaquin. Hypertension: Resume hydrochlorothiazide. Dysphagia: likely contributing to respiratory problems. On D3 honey thick. Change abx to augmentin   Code Status:  full Family Communication:  D/w sister at bedside Disposition Plan:  home   HPI/Subjective:  No complaints  Objective: Filed Vitals:   03/25/13 2201  BP: 130/65  Pulse: 75  Temp: 98.2 F (36.8 C)  Resp: 18   No intake or output data in the 24 hours ending 03/26/13 1313 Filed Weights   03/23/13 2021  Weight: 77.1 kg (169 lb 15.6 oz)    Exam:   General:  More comfortable.  eating    Cardiovascular: RRR without MGR  Respiratory: less wheeze. Breathing nonlabored  Abdomen: obese, s, nt, nd  Ext: no CCE  Basic Metabolic Panel:  Recent Labs Lab 03/23/13 1348 03/24/13 0300 03/25/13 1550  NA 136 134* 139  K 3.2* 4.0 3.4*  CL 93* 93* 95*  CO2 32 31 27  GLUCOSE 82 123* 262*  BUN 20 20 27*  CREATININE 0.79 0.80 0.88  CALCIUM 9.0 8.7 8.1*   Liver Function Tests:  Recent Labs Lab 03/24/13 0300 03/25/13 1550  AST 57* 115*  ALT 33 60*  ALKPHOS 274* 263*  BILITOT 0.4 0.4  PROT 6.6 6.5  ALBUMIN 2.8* 2.8*   No results found for this basename: LIPASE, AMYLASE,  in the last 168 hours No results found for this basename: AMMONIA,  in the last 168 hours CBC:  Recent Labs Lab 03/23/13 1348 03/24/13 0300 03/25/13 1550  WBC 9.2 8.0 12.9*  NEUTROABS 8.0*  --  11.8*  HGB 11.4* 10.5* 10.1*  HCT 35.2*  32.8* 31.4*  MCV 77.9* 78.5 77.9*  PLT 109* 92* 128*   Cardiac Enzymes:  Recent Labs Lab 03/23/13 1348 03/23/13 2210 03/24/13 0300 03/24/13 0910  TROPONINI <0.30 <0.30 <0.30 <0.30   BNP (last 3 results)  Recent Labs  03/23/13 1348 03/25/13 1550  PROBNP 488.0* 914.3*   CBG:  Recent Labs Lab 03/25/13 1145 03/25/13 1622 03/25/13 2157 03/26/13 0734 03/26/13 1202  GLUCAP 242* 248* 196* 165* 159*    Recent Results (from the past 240 hour(s))  URINE CULTURE     Status: None   Collection Time    03/23/13  1:20 PM      Result Value Range Status   Specimen Description URINE, RANDOM   Final   Special Requests NONE   Final   Culture  Setup Time     Final   Value: 03/23/2013 20:01     Performed at [facility] Count     Final   Value: >=100,000 COLONIES/ML     Performed at [facility]   Culture     Final   Value: ESCHERICHIA COLI     Performed at [facility]   Report Status 03/25/2013 FINAL   Final   Organism ID, Bacteria ESCHERICHIA COLI   Final     Studies: Dg Chest Port 1 View  03/25/2013   CLINICAL DATA:  Shortness of breath  EXAM: PORTABLE CHEST - 1 VIEW  COMPARISON:  03/23/2013  FINDINGS: Low lung volumes. No new focal regions of consolidation or new focal infiltrates. The interstitial findings are slightly more prominent. Cardiac silhouette is enlarged. A right shoulder arthroplasty is identified.  IMPRESSION: Slight increased prominence of interstitial findings may represent mild pulmonary edema. An infectious or inflammatory interstitial infiltrate cannot be excluded clinically appropriate.   Electronically Signed   By: Salome Holmes M.D.   On: 03/25/2013 16:02   Dg Swallowing Func-speech Pathology  03/26/2013   Breck Coons [place], CCC-SLP     03/26/2013  9:51 AM Objective Swallowing Evaluation: Modified Barium Swallowing Study   Patient Details  Name: Deborah Watkins MRN: [id number] Date of Birth: 09/23/1930  Today's Date:  03/26/2013 Time: 0912-0930 SLP Time Calculation (min): 18 min  Past Medical History:  Past Medical History  Diagnosis Date  . Mental retardation   . Hypertension   . Diabetes mellitus   . Arthritis    Past Surgical History: History reviewed. No pertinent past  surgical history. HPI:  79 y.o. female admitted to [facility] on 03/23/13 with cough/SOB.  Dx  with bronchitis.  Of note, pt lives with her sister and is  mentally handicapped.  Sister reports frequent choking at home.     Assessment / Plan / Recommendation Clinical Impression  Dysphagia Diagnosis: Moderate pharyngeal phase dysphagia Clinical impression: Pt. exhibited moderate pharyngeal dyphagia  with both sensory and motor impairments.  Delayed swallow  initiation led to frank aspiration just prior to initiating  swallow with teaspoon nectar barium.  Pt. not likely to comply  with swallow strategies due to cognitive deficits.  She appeared  to have C2-3 osteophyte (fusion?) however epiglottis is below  this level and inversion is not impaired, however cannot rule out  some degree dyscoordination.  Oral prep and mastication with  cracker was WFL's.  Brief esophageal scan did not reveal  abnormalitites (study does not diagnose below level of UES).  SLP  recommends a Dys 3 diet and honey thick liquids with 2 swallows,  pills whole in applesauce and full supervision.  Uncertain of  dysphagia prognosis without known etiology (likley chronic with  question of acute factors?).  ST will follow for safety and  continued education with sister.      Treatment Recommendation  Therapy as outlined in treatment plan below    Diet Recommendation Dysphagia 3 (Mechanical Soft);Honey-thick  liquid   Liquid Administration via: Cup;No straw Medication Administration: Whole meds with puree Supervision: Patient able to self feed;Full supervision/cueing  for compensatory strategies Compensations: Slow rate;Small sips/bites;Multiple dry swallows  after each bite/sip Postural Changes and/or  Swallow Maneuvers: Seated upright 90  degrees    Other  Recommendations Oral Care Recommendations: Oral care BID   Follow Up Recommendations  Home health SLP    Frequency and Duration min 2x/week  2 weeks   Pertinent Vitals/Pain WDL           Reason for Referral Objectively evaluate swallowing function   Oral Phase Oral Preparation/Oral Phase Oral Phase: WFL   Pharyngeal Phase Pharyngeal Phase Pharyngeal Phase: Impaired Pharyngeal - Honey Pharyngeal - Honey Teaspoon: Delayed swallow initiation;Premature  spillage to valleculae;Pharyngeal residue - valleculae;Pharyngeal  residue - pyriform sinuses;Reduced tongue base retraction;Reduced  laryngeal elevation Pharyngeal - Honey Cup: Pharyngeal residue - pyriform  sinuses;Pharyngeal residue - valleculae;Reduced laryngeal  elevation;Reduced tongue base retraction;Delayed swallow  initiation;Premature spillage to valleculae Pharyngeal - Nectar Pharyngeal - Nectar Teaspoon: Delayed swallow  initiation;Premature spillage to  valleculae;Penetration/Aspiration before swallow;Pharyngeal  residue - pyriform sinuses;Pharyngeal residue -  valleculae;Reduced tongue base retraction;Reduced laryngeal  elevation;Reduced airway/laryngeal closure Penetration/Aspiration details (nectar teaspoon): Material enters  airway, passes BELOW cords and not ejected out despite cough  attempt by patient Pharyngeal - Solids Pharyngeal - Puree: Delayed swallow initiation;Premature spillage  to valleculae;Pharyngeal residue - valleculae;Pharyngeal residue  - pyriform sinuses;Reduced tongue base retraction;Reduced  laryngeal elevation Pharyngeal - Regular: Pharyngeal residue - valleculae;Pharyngeal  residue - pyriform sinuses;Reduced tongue base retraction;Reduced  laryngeal elevation  Cervical Esophageal Phase    GO    Cervical Esophageal Phase Cervical Esophageal Phase: Impaired Cervical Esophageal Phase - Comment Cervical Esophageal Comment:  (mild stasis)         Breck Coons Lit[place] M.Ed CCC-SLP  Pager [phone]  03/26/2013     Scheduled Meds: . albuterol  2.5 mg Nebulization QID  . aspirin EC  81 mg Oral Daily  . atorvastatin  10 mg Oral QHS  . dextromethorphan-guaiFENesin  1 tablet Oral BID  . enoxaparin (LOVENOX) injection  40 mg Subcutaneous Q24H  . furosemide  40 mg Intravenous BID  . glimepiride  1 mg Oral QAC breakfast  . hydrochlorothiazide  25 mg Oral q morning - 10a  . insulin aspart  0-20 Units Subcutaneous TID WC  . insulin aspart  0-5 Units Subcutaneous QHS  . levofloxacin  500 mg Oral Daily  . methylPREDNISolone (SOLU-MEDROL) injection  125 mg Intravenous Q6H  . sertraline  100 mg Oral q morning - 10a  . sodium chloride  3 mL Intravenous Q12H   Continuous Infusions:   Time spent: 25 minutes  Beatric Fulop L  [facility] Pager [phone]. If 7PM-7AM, please contact night-coverage at [email or web address], password [facility] 03/26/2013, 1:13 PM  LOS: 3 days

## 2013-03-26 NOTE — Progress Notes (Signed)
Physical Therapy Treatment Patient Details Name: Deborah Watkins MRN: [id number] DOB: [birth year] Today's Date: 03/26/2013 Time: 2952-8413 PT Time Calculation (min): 30 min  PT Assessment / Plan / Recommendation  History of Present Illness 79 y.o. female admitted to [facility] on 03/23/13 with cough/SOB.  Dx with bronchitis.  Of note, pt lives with her sister and is mentally handicapped.    PT Comments   Patient is near her baseline level of function per family report.  Pt is very pleasant throughout session.  She requires total A with all bed mobility and functional activities out of bed.  Patient has this A available at home, and will continue to need help to ensure safety.  Patient becomes rapidly fatigued and needs a rest break with minimal activity.  Patient will benefit from skilled PT intervention to address functional limitations as she plans to d/c home with help.   Follow Up Recommendations  No PT follow up;Supervision/Assistance - 24 hour           Equipment Recommendations  None recommended by PT       Frequency Min 3X/week      Plan Current plan remains appropriate    Precautions / Restrictions Precautions Precautions: Fall Restrictions Weight Bearing Restrictions: No   Pertinent Vitals/Pain SpO2 98% halfway through walking, 94% after walking. On room air throughout session, as [place] was removed when we entered the room.  DOE 3/4 with short distance walking.    Mobility  Bed Mobility Bed Mobility: Supine to Sit;Sitting - Scoot to Edge of Bed Supine to Sit: 1: +2 Total assist;HOB elevated Supine to Sit: Patient Percentage: 60% Sitting - Scoot to Edge of Bed: 1: +2 Total assist Sitting - Scoot to Edge of Bed: Patient Percentage: 60% Details for Bed Mobility Assistance: Patient requires B hand hold assist to pull up into upright sitting.  She also requires A to pull the pad with her while she is performing bed mobility.  Requires hip A to move pad as she scoots to  EOB. Transfers Transfers: Sit to Stand;Stand to Sit Sit to Stand: 1: +2 Total assist;With upper extremity assist;From bed;From toilet Sit to Stand: Patient Percentage: 70% Stand to Sit: 1: +2 Total assist Stand to Sit: Patient Percentage: 80% Details for Transfer Assistance: Patient requires B hand hold assist to pull up into upright standing.  She pulls on arms as you support her into standing.  She tends to lean posteriorly and requires frequent VC and TC to achieve upright posture. Ambulation/Gait Ambulation/Gait Assistance: 1: +2 Total assist Ambulation/Gait: Patient Percentage: 70% Ambulation Distance (Feet): 25 Feet Assistive device: 2 person hand held assist Ambulation/Gait Assistance Details: Patient requires HHA for balance while ambulating as she is very unsteady without A.  Family reports that this A level is her baseline.  Patient tends to lean backwards and to her R, requiring support to maintain balance.  Rest break x1 sitting for 5 minutes to catch breath and work on sitting balance.  Patient is incontinent of urine multiple time through session. RN notified after we addressed. Gait Pattern: Step-through pattern;Decreased stride length Gait velocity: decreased General Gait Details: Breathing very hard throughout ambulating short distance, but her spO2 remains 98 halfway through and 94% after session. Stairs: No      PT Goals (current goals can now be found in the care plan section) Acute Rehab PT Goals Patient Stated Goal: family would like for her to return home PT Goal Formulation: With patient/family Time For Goal Achievement: 04/07/13  Potential to Achieve Goals: Good  Visit Information  Last PT Received On: 03/26/13 Assistance Needed: +2 History of Present Illness: 79 y.o. female admitted to [facility] on 03/23/13 with cough/SOB.  Dx with bronchitis.  Of note, pt lives with her sister and is mentally handicapped.     Subjective Data  Patient Stated Goal: family would  like for her to return home   Cognition  Cognition Arousal/Alertness: Awake/alert Behavior During Therapy: WFL for tasks assessed/performed Overall Cognitive Status: History of cognitive impairments - at baseline    Balance  Static Sitting Balance Static Sitting - Balance Support: Bilateral upper extremity supported Static Sitting - Level of Assistance: 2: Max assist Static Sitting - Comment/# of Minutes: Tends to lean posteriorly, but she can intermittently support herself upright for a couple seconds.  End of Session PT - End of Session Equipment Utilized During Treatment: Gait belt Activity Tolerance: Patient limited by fatigue Patient left: in chair;with chair alarm set;with call bell/phone within reach;with family/visitor present   GP    Barrie Dunker, SPT Pager:  [phone]  Barrie Dunker 03/26/2013, 1:10 PM

## 2013-03-26 NOTE — Progress Notes (Addendum)
Pt with increased crackles at bases and wheezing. Has a weak cough and unable to clear secretions as well. No improvement after breathing tx. VSS stable and pt 95% on 2L O2. Programmer, applications paged and ordered 40mg  of IV lasix. Will continue to monitor.

## 2013-03-26 NOTE — Progress Notes (Signed)
Read, reviewed, edited and agree with student's findings and recommendations.  Zakaria Fromer B. Mayford Alberg, PT, DPT #[phone]  

## 2013-03-27 DIAGNOSIS — E876 Hypokalemia: Secondary | ICD-10-CM

## 2013-03-27 DIAGNOSIS — E871 Hypo-osmolality and hyponatremia: Secondary | ICD-10-CM | POA: Diagnosis present

## 2013-03-27 LAB — BASIC METABOLIC PANEL
BUN: 32 mg/dL — ABNORMAL HIGH (ref 6–23)
CO2: 37 mEq/L — ABNORMAL HIGH (ref 19–32)
Calcium: 8.4 mg/dL (ref 8.4–10.5)
Chloride: 94 mEq/L — ABNORMAL LOW (ref 96–112)
Creatinine, Ser: 0.77 mg/dL (ref 0.50–1.10)
GFR calc Af Amer: 90 mL/min — ABNORMAL LOW (ref >90)
GFR calc non Af Amer: 77 mL/min — ABNORMAL LOW (ref >90)
Glucose, Bld: 166 mg/dL — ABNORMAL HIGH (ref 70–99)
Potassium: 2.6 mEq/L — CL (ref 3.7–5.3)
Sodium: 144 mEq/L (ref 137–147)

## 2013-03-27 LAB — GLUCOSE, CAPILLARY
GLUCOSE-CAPILLARY: 171 mg/dL — AB (ref 70–99)
GLUCOSE-CAPILLARY: 123 mg/dL — AB (ref 70–99)
Glucose-Capillary: 187 mg/dL — ABNORMAL HIGH (ref 70–99)
Glucose-Capillary: 272 mg/dL — ABNORMAL HIGH (ref 70–99)

## 2013-03-27 LAB — MAGNESIUM: MAGNESIUM: 2.5 mg/dL (ref 1.5–2.5)

## 2013-03-27 LAB — PRO B NATRIURETIC PEPTIDE: Pro B Natriuretic peptide (BNP): 1032 pg/mL — ABNORMAL HIGH (ref 0–450)

## 2013-03-27 MED ORDER — FUROSEMIDE 40 MG PO TABS
40.0000 mg | ORAL_TABLET | Freq: Every day | ORAL | Status: DC
  Administered 2013-03-27 – 2013-03-29 (×3): 40 mg via ORAL
  Filled 2013-03-27 (×3): qty 1

## 2013-03-27 MED ORDER — POTASSIUM CHLORIDE 20 MEQ/15ML (10%) PO LIQD
40.0000 meq | Freq: Three times a day (TID) | ORAL | Status: DC
  Administered 2013-03-27: 40 meq via ORAL
  Filled 2013-03-27 (×3): qty 30

## 2013-03-27 MED ORDER — POTASSIUM CHLORIDE CRYS ER 10 MEQ PO TBCR
10.0000 meq | EXTENDED_RELEASE_TABLET | Freq: Every day | ORAL | Status: DC
  Administered 2013-03-27 – 2013-03-29 (×9): 10 meq via ORAL
  Filled 2013-03-27 (×10): qty 1

## 2013-03-27 NOTE — Progress Notes (Signed)
CRITICAL VALUE ALERT  Critical value received: POTASSIUM=2.6  Date of notification: 03/27/2013  Time of notification:  0715  Critical value read back: YES  Nurse who received alert: Eduardo OsierJESSICA Fillmore Bynum, RN  MD notified (1st page): DR. Paris Lore. SULLIVAN  Time of first page: 0715  MD notified (2nd page):  Time of second page:  Responding MD: DR. Paris Lore. SULLIVAN   Time MD responded: TEXT PAGE SENT FOR CRITICAL LAB VALUE AND MD PLACED SUPPLEMENTATION ORDERS IN EPIC

## 2013-03-27 NOTE — Progress Notes (Signed)
[facility] PROGRESS NOTE  Hillery HunterDorothy E Shed [id number] DOB: June 15, 1930 DOA: 03/23/2013 PCP: Lupita RaiderSHAW,KIMBERLEE, MD  Assessment/Plan:  Principal Problem:   Acute bronchitis Active Problems:   Elevated brain natriuretic peptide (BNP) level: Echocardiogram shows preserved ejection fraction. Unable to evaluate diastolic function: continue lasix   Diabetes mellitus: continue current   Bronchospasm, acute: more rhonchorous/wheeze today.  Hypokalemia: replete MR Escherichia coli UTI:  Hypertension: Resume hydrochlorothiazide. Dysphagia: likely contributing to respiratory problems. On D3 honey thick. Change abx to augmentin  Pt with coarse breath sounds today. Suspect intermittent aspiration leading to recurrent difficulty breathing. Long term prognosis guarded. Will likely continue to aspirate. Discussed with sister.  Will have family meeting tomorrow  Code Status:  full Family Communication:  D/w sister at bedside Disposition Plan:  home   HPI/Subjective:  No complaints  Objective: Filed Vitals:   03/27/13 1300  BP: 134/56  Pulse: 74  Temp: 98.3 F (36.8 C)  Resp: 18    Intake/Output Summary (Last 24 hours) at 03/27/13 2032 Last data filed at 03/27/13 1700  Gross per 24 hour  Intake    600 ml  Output      0 ml  Net    600 ml   Filed Weights   03/23/13 2021 03/27/13 0619  Weight: 77.1 kg (169 lb 15.6 oz) 75.6 kg (166 lb 10.7 oz)    Exam:   General:  comfortable    Cardiovascular: RRR without MGR  Respiratory: rhonchi, wheeze  Abdomen: obese, s, nt, nd  Ext: no CCE  Basic Metabolic Panel:  Recent Labs Lab 03/23/13 1348 03/24/13 0300 03/25/13 1550 03/27/13 0648  NA 136 134* 139 144  K 3.2* 4.0 3.4* 2.6*  CL 93* 93* 95* 94*  CO2 32 31 27 37*  GLUCOSE 82 123* 262* 166*  BUN 20 20 27* 32*  CREATININE 0.79 0.80 0.88 0.77  CALCIUM 9.0 8.7 8.1* 8.4  MG  --   --   --  2.5   Liver Function Tests:  Recent Labs Lab 03/24/13 0300  03/25/13 1550  AST 57* 115*  ALT 33 60*  ALKPHOS 274* 263*  BILITOT 0.4 0.4  PROT 6.6 6.5  ALBUMIN 2.8* 2.8*   No results found for this basename: LIPASE, AMYLASE,  in the last 168 hours No results found for this basename: AMMONIA,  in the last 168 hours CBC:  Recent Labs Lab 03/23/13 1348 03/24/13 0300 03/25/13 1550  WBC 9.2 8.0 12.9*  NEUTROABS 8.0*  --  11.8*  HGB 11.4* 10.5* 10.1*  HCT 35.2* 32.8* 31.4*  MCV 77.9* 78.5 77.9*  PLT 109* 92* 128*   Cardiac Enzymes:  Recent Labs Lab 03/23/13 1348 03/23/13 2210 03/24/13 0300 03/24/13 0910  TROPONINI <0.30 <0.30 <0.30 <0.30   BNP (last 3 results)  Recent Labs  03/23/13 1348 03/25/13 1550 03/27/13 0648  PROBNP 488.0* 914.3* 1032.0*   CBG:  Recent Labs Lab 03/26/13 1643 03/26/13 2056 03/27/13 0738 03/27/13 1134 03/27/13 1631  GLUCAP 170* 251* 171* 187* 272*    Recent Results (from the past 240 hour(s))  URINE CULTURE     Status: None   Collection Time    03/23/13  1:20 PM      Result Value Range Status   Specimen Description URINE, RANDOM   Final   Special Requests NONE   Final   Culture  Setup Time     Final   Value: 03/23/2013 20:01     [facility]formed at [facility]   [place] Count  Final   Value: >=100,000 COLONIES/ML     Performed at [facility]   Culture     Final   Value: ESCHERICHIA COLI     Performed at [facility]   Report Status 03/25/2013 FINAL   Final   Organism ID, Bacteria ESCHERICHIA COLI   Final     Studies: Dg Swallowing Func-speech Pathology  03/26/2013   Breck Coons [place], CCC-SLP     03/26/2013  9:51 AM Objective Swallowing Evaluation: Modified Barium Swallowing Study   Patient Details  Name: TONJUA ROSSETTI MRN: [id number] Date of Birth: 08-21-1930  Today's Date: 03/26/2013 Time: 0912-0930 SLP Time Calculation (min): 18 min  Past Medical History:  Past Medical History  Diagnosis Date  . Mental retardation   . Hypertension   . Diabetes mellitus    . Arthritis    Past Surgical History: History reviewed. No pertinent past  surgical history. HPI:  80 y.o. female admitted to [facility] on 03/23/13 with cough/SOB.  Dx  with bronchitis.  Of note, pt lives with her sister and is  mentally handicapped.  Sister reports frequent choking at home.     Assessment / Plan / Recommendation Clinical Impression  Dysphagia Diagnosis: Moderate pharyngeal phase dysphagia Clinical impression: Pt. exhibited moderate pharyngeal dyphagia  with both sensory and motor impairments.  Delayed swallow  initiation led to frank aspiration just prior to initiating  swallow with teaspoon nectar barium.  Pt. not likely to comply  with swallow strategies due to cognitive deficits.  She appeared  to have C2-3 osteophyte (fusion?) however epiglottis is below  this level and inversion is not impaired, however cannot rule out  some degree dyscoordination.  Oral prep and mastication with  cracker was WFL's.  Brief esophageal scan did not reveal  abnormalitites (study does not diagnose below level of UES).  SLP  recommends a Dys 3 diet and honey thick liquids with 2 swallows,  pills whole in applesauce and full supervision.  Uncertain of  dysphagia prognosis without known etiology (likley chronic with  question of acute factors?).  ST will follow for safety and  continued education with sister.      Treatment Recommendation  Therapy as outlined in treatment plan below    Diet Recommendation Dysphagia 3 (Mechanical Soft);Honey-thick  liquid   Liquid Administration via: Cup;No straw Medication Administration: Whole meds with puree Supervision: Patient able to self feed;Full supervision/cueing  for compensatory strategies Compensations: Slow rate;Small sips/bites;Multiple dry swallows  after each bite/sip Postural Changes and/or Swallow Maneuvers: Seated upright 90  degrees    Other  Recommendations Oral Care Recommendations: Oral care BID   Follow Up Recommendations  Home health SLP    Frequency and Duration  min 2x/week  2 weeks   Pertinent Vitals/Pain WDL           Reason for Referral Objectively evaluate swallowing function   Oral Phase Oral Preparation/Oral Phase Oral Phase: WFL   Pharyngeal Phase Pharyngeal Phase Pharyngeal Phase: Impaired Pharyngeal - Honey Pharyngeal - Honey Teaspoon: Delayed swallow initiation;Premature  spillage to valleculae;Pharyngeal residue - valleculae;Pharyngeal  residue - pyriform sinuses;Reduced tongue base retraction;Reduced  laryngeal elevation Pharyngeal - Honey Cup: Pharyngeal residue - pyriform  sinuses;Pharyngeal residue - valleculae;Reduced laryngeal  elevation;Reduced tongue base retraction;Delayed swallow  initiation;Premature spillage to valleculae Pharyngeal - Nectar Pharyngeal - Nectar Teaspoon: Delayed swallow  initiation;Premature spillage to  valleculae;Penetration/Aspiration before swallow;Pharyngeal  residue - pyriform sinuses;Pharyngeal residue -  valleculae;Reduced tongue base retraction;Reduced laryngeal  elevation;Reduced airway/laryngeal closure Penetration/Aspiration  details (nectar teaspoon): Material enters  airway, passes BELOW cords and not ejected out despite cough  attempt by patient Pharyngeal - Solids Pharyngeal - Puree: Delayed swallow initiation;Premature spillage  to valleculae;Pharyngeal residue - valleculae;Pharyngeal residue  - pyriform sinuses;Reduced tongue base retraction;Reduced  laryngeal elevation Pharyngeal - Regular: Pharyngeal residue - valleculae;Pharyngeal  residue - pyriform sinuses;Reduced tongue base retraction;Reduced  laryngeal elevation  Cervical Esophageal Phase    GO    Cervical Esophageal Phase Cervical Esophageal Phase: Impaired Cervical Esophageal Phase - Comment Cervical Esophageal Comment:  (mild stasis)         Breck Coons Litaker M.Ed CCC-SLP Pager [phone]  03/26/2013     Scheduled Meds: . amoxicillin-clavulanate  1 tablet Oral BID  . aspirin EC  81 mg Oral Daily  . atorvastatin  10 mg Oral QHS  . enoxaparin (LOVENOX)  injection  40 mg Subcutaneous Q24H  . furosemide  40 mg Oral Daily  . glimepiride  1 mg Oral QAC breakfast  . hydrochlorothiazide  25 mg Oral q morning - 10a  . insulin aspart  0-20 Units Subcutaneous TID WC  . insulin aspart  0-5 Units Subcutaneous QHS  . methylPREDNISolone (SOLU-MEDROL) injection  60 mg Intravenous Q6H  . potassium chloride  10 mEq Oral 5 X Daily  . sertraline  100 mg Oral q morning - 10a  . sodium chloride  3 mL Intravenous Q12H   Continuous Infusions:   Time spent: 25 minutes  Jakelyn Squyres L  [facility] Pager [phone]. If 7PM-7AM, please contact night-coverage at [email or web address], password [facility] 03/27/2013, 8:32 PM  LOS: 4 days

## 2013-03-28 ENCOUNTER — Inpatient Hospital Stay (HOSPITAL_COMMUNITY): Payer: Medicare Other

## 2013-03-28 DIAGNOSIS — Z515 Encounter for palliative care: Secondary | ICD-10-CM

## 2013-03-28 LAB — GLUCOSE, CAPILLARY
Glucose-Capillary: 232 mg/dL — ABNORMAL HIGH (ref 70–99)
Glucose-Capillary: 306 mg/dL — ABNORMAL HIGH (ref 70–99)
Glucose-Capillary: 215 mg/dL — ABNORMAL HIGH (ref 70–99)
Glucose-Capillary: 222 mg/dL — ABNORMAL HIGH (ref 70–99)

## 2013-03-28 LAB — BASIC METABOLIC PANEL
BUN: 31 mg/dL — AB (ref 6–23)
CO2: 32 mEq/L (ref 19–32)
CREATININE: 0.76 mg/dL (ref 0.50–1.10)
Calcium: 8.7 mg/dL (ref 8.4–10.5)
Chloride: 95 mEq/L — ABNORMAL LOW (ref 96–112)
GFR calc non Af Amer: 78 mL/min — ABNORMAL LOW (ref >90)
GFR, EST AFRICAN AMERICAN: 90 mL/min — AB (ref >90)
Glucose, Bld: 267 mg/dL — ABNORMAL HIGH (ref 70–99)
Potassium: 3.2 mEq/L — ABNORMAL LOW (ref 3.7–5.3)
Sodium: 146 mEq/L (ref 137–147)

## 2013-03-28 LAB — MAGNESIUM: Magnesium: 2.5 mg/dL (ref 1.5–2.5)

## 2013-03-28 MED ORDER — PREDNISONE 20 MG PO TABS
40.0000 mg | ORAL_TABLET | Freq: Two times a day (BID) | ORAL | Status: DC
  Administered 2013-03-28 – 2013-03-29 (×2): 40 mg via ORAL
  Filled 2013-03-28 (×4): qty 2

## 2013-03-28 NOTE — Discharge Instructions (Signed)
[facility]osp[facility] [phone]

## 2013-03-28 NOTE — Progress Notes (Addendum)
Notified by [facility], patient and family request services of [facility] after discharge.  Spoke with patient's sister Gillis EndsSybil (c: [phone]) at bedside, patient awake initially, audible upper airway congestion noted, pt's sioster stated pt does not like to wear O2 however has done well with using O2 and intermittent neb tx which seem to help her breathing; pt stated she felt 'ok' quickly fell asleep and slept through entire visit.  Spoke with Sybil at bedside, she indicated after discussion with Dr Lendell CapriceSullivan she is aware that Nicole CellaDorothy has probably been aspirating all along and has had trouble with her swallowing for months now; she indicated she wants a comfort approach to her care and hopes to avoid further hospitalizations by treating what could be treated at home; initiated education related to hospice services, philosophy and team approach to care Sybil voiced good understanding of information provided. Per discussion plan is to d/c home by personal vehicle tomorrow.  Gillis EndsSybil is [facility] at first available time- Sunday 03/30/13 at 6 pm  DME needs discussed initially Sybil was not inclined to have any DME however she feels pt will do better with a hospital bed and wants to have O2 available should she have increased shortness of breath; Sybil also questioned if pt would continue with Neb tx if needed and Clinical research associatewriter requested she review this with MD at discharge Request: Complete O2 pkg B O2 @ 2LNC; and Package D: fully electric hospital bed with full rails, AP&P mattress and overbed table; pt has walker, w/c and BSC in the home *Please call sister Wray KearnsSybil Wood c: [phone] to arrange equipment delivery time.  Initial paperwork [facility] Completed d/c summary will need to be [facility] @ [phone] when final Please notify [facility] when patient is ready to leave unit at d/c call [phone]   [facility] information and  contact numbers also given to [facility] during visit.   Above information shared with [facility] Please call with any questions or concerns   Valente DavidMargie Johnpatrick Jenny, RN 03/28/2013, 4:21 PM [facility] RN Liaison [phone]

## 2013-03-28 NOTE — Progress Notes (Signed)
Speech Language Pathology Treatment: Dysphagia  Patient Details Name: Deborah Watkins MRN: [id number] DOB: [birth year] Today's Date: 03/28/2013 Time: [id number] SLP Time Calculation (min): 11 min  Assessment / Plan / Recommendation Clinical Impression  Pt seen for f/u dysphagia treatment. Upon chart review, note updated GOC with decision to transition to hospice care. Pt sleeping upon SLP arrival, so PO trials were held per sister's request. Session focused on education, including review of results and recommendations from MBS. Sister asked several questions regarding diet textures, and verbalized her understanding of the information provided. SLP reviewed instructions for thickener as well. SLP to f/u briefly as pt remains in hospital for further education and support.   HPI HPI: 80 [facility].o. [facility] with cough/SOB.  Dx with bronchitis.  Of note, pt lives with her sister and is mentally handicapped.  Sister reports frequent choking at home.   Pertinent Vitals N/A  SLP Plan  Continue with current plan of care    Recommendations Diet recommendations: Dysphagia 3 (mechanical soft);Honey-thick liquid Liquids provided via: Cup;No straw Medication Administration: Whole meds with puree Supervision: Patient able to self feed;Full supervision/cueing for compensatory strategies Compensations: Slow rate;Small sips/bites;Multiple dry swallows after each bite/sip Postural Changes and/or Swallow Maneuvers: Seated upright 90 degrees              Oral Care Recommendations: Oral care BID Follow up Recommendations: Home health SLP;Other (comment) (per family wishes) Plan: Continue with current plan of care    GO       Deborah Watkins, Deborah Watkins [phone]  Deborah Hamaiewonsky, Gwen Edler 03/28/2013, 5:02 PM

## 2013-03-28 NOTE — Progress Notes (Signed)
[facility] PROGRESS NOTE  Deborah Watkins UJW:[id number] DOB: 1930-11-11 DOA: 03/23/2013 PCP: Lupita Raider, MD  Assessment/Plan:  Principal Problem:   Acute bronchitis Active Problems:   Elevated brain natriuretic peptide (BNP) level: Echocardiogram shows preserved ejection fraction. Unable to evaluate diastolic function: continue lasix   Diabetes mellitus: continue current   Bronchospasm, acute: more rhonchorous/wheeze today.  Hypokalemia: replete MR Escherichia coli UTI:  Hypertension: Resume hydrochlorothiazide. Dysphagia: likely contributing to respiratory problems. On D3 honey thick. Change abx to augmentin  Breath sounds very coarse with upper lower airway rhonchi. Patient is undoubtedly continuing to aspirate despite modified diet. Prognosis is poor and she will likely continue to have respiratory infections and distress. Prognosis less than 6 months. Discussed with family. They would like to make patient DO NOT RESUSCITATE and take her home with hospice. Have consulted care management. Please page me if patient is able to be discharged today. Otherwise will discharge tomorrow after hospice arrangements made.  Code Status:  DO NOT RESUSCITATE Family Communication:  Brother sister and sister-in-law Disposition Plan:  home with hospice   HPI/Subjective:  No complaints  Objective: Filed Vitals:   03/28/13 0505  BP: 139/63  Pulse: 78  Temp: 98.8 F (37.1 C)  Resp: 18    Intake/Output Summary (Last 24 hours) at 03/28/13 1233 Last data filed at 03/27/13 1700  Gross per 24 hour  Intake    200 ml  Output      0 ml  Net    200 ml   Filed Weights   03/23/13 2021 03/27/13 0619 03/28/13 0505  Weight: 77.1 kg (169 lb 15.6 oz) 75.6 kg (166 lb 10.7 oz) 74.844 kg (165 lb)    Exam:   General:  Eating lunch. Coughing.    Cardiovascular: RRR without MGR  Respiratory: rhonchi, wheeze. Prolonged expiratory phase.  Abdomen: obese, s, nt, nd  Ext: no  CCE  Basic Metabolic Panel:  Recent Labs Lab 03/23/13 1348 03/24/13 0300 03/25/13 1550 03/27/13 0648 03/28/13 0835  NA 136 134* 139 144 146  K 3.2* 4.0 3.4* 2.6* 3.2*  CL 93* 93* 95* 94* 95*  CO2 32 31 27 37* 32  GLUCOSE 82 123* 262* 166* 267*  BUN 20 20 27* 32* 31*  CREATININE 0.79 0.80 0.88 0.77 0.76  CALCIUM 9.0 8.7 8.1* 8.4 8.7  MG  --   --   --  2.5 2.5   Liver Function Tests:  Recent Labs Lab 03/24/13 0300 03/25/13 1550  AST 57* 115*  ALT 33 60*  ALKPHOS 274* 263*  BILITOT 0.4 0.4  PROT 6.6 6.5  ALBUMIN 2.8* 2.8*   No results found for this basename: LIPASE, AMYLASE,  in the last 168 hours No results found for this basename: AMMONIA,  in the last 168 hours CBC:  Recent Labs Lab 03/23/13 1348 03/24/13 0300 03/25/13 1550  WBC 9.2 8.0 12.9*  NEUTROABS 8.0*  --  11.8*  HGB 11.4* 10.5* 10.1*  HCT 35.2* 32.8* 31.4*  MCV 77.9* 78.5 77.9*  PLT 109* 92* 128*   Cardiac Enzymes:  Recent Labs Lab 03/23/13 1348 03/23/13 2210 03/24/13 0300 03/24/13 0910  TROPONINI <0.30 <0.30 <0.30 <0.30   BNP (last 3 results)  Recent Labs  03/23/13 1348 03/25/13 1550 03/27/13 0648  PROBNP 488.0* 914.3* 1032.0*   CBG:  Recent Labs Lab 03/27/13 1134 03/27/13 1631 03/27/13 2111 03/28/13 0728 03/28/13 1125  GLUCAP 187* 272* 123* 232* 306*    Recent Results (from the past 240 hour(s))  URINE CULTURE  Status: None   Collection Time    03/23/13  1:20 PM      Result Value Range Status   Specimen Description URINE, RANDOM   Final   Special Requests NONE   Final   Culture  Setup Time     Final   Value: 03/23/2013 20:01     Performed at [facility]   Colony Count     Final   Value: >=100,000 COLONIES/ML     Performed at [facility]   Culture     Final   Value: ESCHERICHIA COLI     Performed at [facility]   Report Status 03/25/2013 FINAL   Final   Organism ID, Bacteria ESCHERICHIA COLI   Final     Studies: Dg Chest Port  1 View  03/28/2013   CLINICAL DATA:  Pneumonia.  EXAM: PORTABLE CHEST - 1 VIEW  COMPARISON:  06/13/2012  FINDINGS: Cardiomegaly.  Central pulmonary vascular prominence.  No segmental consolidation or gross pneumothorax.  Calcified aorta.  IMPRESSION: Cardiomegaly.  Central pulmonary vascular prominence.  No segmental consolidation or gross pneumothorax.   Electronically Signed   By: Bridgett LarssonSteve  Olson M.D.   On: 03/28/2013 08:03    Scheduled Meds: . amoxicillin-clavulanate  1 tablet Oral BID  . aspirin EC  81 mg Oral Daily  . atorvastatin  10 mg Oral QHS  . enoxaparin (LOVENOX) injection  40 mg Subcutaneous Q24H  . furosemide  40 mg Oral Daily  . glimepiride  1 mg Oral QAC breakfast  . hydrochlorothiazide  25 mg Oral q morning - 10a  . insulin aspart  0-20 Units Subcutaneous TID WC  . insulin aspart  0-5 Units Subcutaneous QHS  . methylPREDNISolone (SOLU-MEDROL) injection  60 mg Intravenous Q6H  . potassium chloride  10 mEq Oral 5 X Daily  . sertraline  100 mg Oral q morning - 10a  . sodium chloride  3 mL Intravenous Q12H   Continuous Infusions:   Time spent: 25 minutes  Valiant Dills L  [facility] Pager [phone]. If 7PM-7AM, please contact night-coverage at [email or web address], password [email or web address] 03/28/2013, 12:33 PM  LOS: 5 days

## 2013-03-29 LAB — GLUCOSE, CAPILLARY
Glucose-Capillary: 165 mg/dL — ABNORMAL HIGH (ref 70–99)
Glucose-Capillary: 255 mg/dL — ABNORMAL HIGH (ref 70–99)

## 2013-03-29 MED ORDER — INFLUENZA VAC SPLIT QUAD 0.5 ML IM SUSP: 0.5000 mL | INTRAMUSCULAR | Status: DC

## 2013-03-29 MED ORDER — MORPHINE SULFATE (CONCENTRATE) 20 MG/ML PO SOLN
10.0000 mg | ORAL | Status: AC | PRN
Start: 2013-03-29 — End: ?

## 2013-03-29 MED ORDER — ACETAMINOPHEN 325 MG PO TABS: 650.0000 mg | ORAL_TABLET | Freq: Four times a day (QID) | ORAL | Status: AC | PRN

## 2013-03-29 MED ORDER — IPRATROPIUM-ALBUTEROL 0.5-2.5 (3) MG/3ML IN SOLN: 3.0000 mL | RESPIRATORY_TRACT | Status: AC | PRN

## 2013-03-29 MED ORDER — PREDNISONE 10 MG PO TABS: ORAL_TABLET | ORAL | Status: AC

## 2013-03-29 MED ORDER — INFLUENZA VAC SPLIT QUAD 0.5 ML IM SUSP
0.5000 mL | Freq: Once | INTRAMUSCULAR | Status: AC
  Administered 2013-03-29: 0.5 mL via INTRAMUSCULAR
  Filled 2013-03-29: qty 0.5

## 2013-03-29 NOTE — Discharge Summary (Signed)
Physician Discharge Summary  Deborah Watkins ZOX:[id number] DOB: 1930/11/10 DOA: 03/23/2013  PCP: Lupita RaiderSHAW,KIMBERLEE, MD  Admit date: 03/23/2013 Discharge date: 03/29/2013  Time spent: greater than 30 minutes  Recommendations for Outpatient Follow-up:  1. Home with hospice for comfort care  Discharge Diagnoses:  Principal Problem:   Acute bronchitis Active Problems:   Elevated brain natriuretic peptide (BNP) level   Diabetes mellitus, uncontrolled due to steroids   Bronchospasm, acute   Mental retardation   Morbid obesity   Dysphagia with probable continued aspiration   Hyponatremia   Hypokalemia   Palliative care encounter   Discharge Condition: stable  Filed Weights   03/27/13 0619 03/28/13 0505 03/29/13 0714  Weight: 75.6 kg (166 lb 10.7 oz) 74.844 kg (165 lb) 74.589 kg (164 lb 7 oz)    History of present illness:  80 year old female who has a past medical history of Mental retardation; Hypertension; Diabetes mellitus; and Arthritis.  Presented to the ED with chief complaint of cough, patient has history of mental retardation and is poor historian. History is obtained from patient's sister at bedside. Has persisted patient has had persistent cough at first she 4 days. She denies chest pain no shortness of breath no nausea vomiting diarrhea no fever.  In the ED chest x-ray showed interstitial edema and BNP is elevated to 488. Patient received albuterol and Rocephin. Still continues to cough. Patient takes Lasix at home for leg edema. She is also on HCTZ 25 mg by mouth daily   Hospital Course:  Admitted to telemetry. Started on diuretics, steroids, bronchodilators and antibiotics.  Echo showed preserved ejection fraction. Bronchospasm slow to improve and worsened after several days of improvement.  After further questioning, patient has had difficulty swallowing with coughing and choking spells noted by family prior to admission.  Speech therapy consulted and recommended dysphagia  2 diet with honey thickened liquids.  Patient continued to have intermittent breathing difficulty. As such, may continue to have continued respiratory difficulties, recurrent pneumonias, etc at home.  After discussion of guarded prognosis, advanced age, etc, with family, they request DNR, home with hospice.  They wish to continue current therapies, but if patient were to worsen, would likely withdraw all but comfort measures  Procedures:  none  Consultations:  Speech therapy  PT  Discharge Exam: Filed Vitals:   03/29/13 0714  BP: 166/82  Pulse: 72  Temp: 97.4 F (36.3 C)  Resp: 18    General: comfortable Cardiovascular: RRR Respiratory: mild expiratory wheeze  Discharge Instructions  Discharge Orders   Future Orders Complete By Expires   Discharge instructions  As directed    Comments:     Diet: soft with thickened liquids. Eat only when fully upright       Medication List         acetaminophen 325 MG tablet  Commonly known as:  TYLENOL  Take 2 tablets (650 mg total) by mouth every 6 (six) hours as needed for mild pain (or Fever >/= 101).     aspirin 81 MG tablet  Take 81 mg by mouth every morning.     atorvastatin 10 MG tablet  Commonly known as:  LIPITOR  Take 10 mg by mouth at bedtime.     fish oil-omega-3 fatty acids 1000 MG capsule  Take 1 g by mouth 2 (two) times daily.     furosemide 20 MG tablet  Commonly known as:  LASIX  Take 20-40 mg by mouth every morning.     glimepiride 1 MG  tablet  Commonly known as:  AMARYL  Take 1 mg by mouth daily before breakfast.     hydrochlorothiazide 25 MG tablet  Commonly known as:  HYDRODIURIL  Take 25 mg by mouth every morning.     HYDROcodone-homatropine 5-1.5 MG/5ML syrup  Commonly known as:  HYCODAN  Take 5 mLs by mouth every 6 (six) hours as needed for cough.     ipratropium-albuterol 0.5-2.5 (3) MG/3ML Soln  Commonly known as:  DUONEB  Take 3 mLs by nebulization every 4 (four) hours as needed  (wheezing or shortness of breath).     morphine 20 MG/ML concentrated solution  Commonly known as:  ROXANOL  Take 0.5 mLs (10 mg total) by mouth every 3 (three) hours as needed for shortness of breath (or pain).     predniSONE 10 MG tablet  Commonly known as:  DELTASONE  4 tablets daily for 2 days, then decrease by 1 tablet every 2 days until off     PRESCRIPTION MEDICATION  Take 20 mg by mouth every morning. Amlodipine 20 mg     sertraline 100 MG tablet  Commonly known as:  ZOLOFT  Take 100 mg by mouth every morning.     Vitamin D 2000 UNITS Caps  Take 2,000 Units by mouth every morning.       No Known Allergies     Follow-up Information   Follow up with [facility] ([facility] to follow aftr d/c please notify whn pt leaves hospital call [phone])    Contact information:   [facility] [address] [phone]       The results of significant diagnostics from this hospitalization (including imaging, microbiology, ancillary and laboratory) are listed below for reference.    Significant Diagnostic Studies: Dg Chest 2 View  03/23/2013   CLINICAL DATA:  Coughing and congestion and dyspnea with history of diabetes and hypertension  EXAM: CHEST  2 VIEW  COMPARISON:  PA and lateral chest x-ray of June 24, 2012  FINDINGS: The lungs are reasonably well inflated. The interstitial markings are mildly increased. The cardiopericardial silhouette is enlarged. The pulmonary vascularity is engorged. There is no significant pleural effusion. 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 There are degenerative disc changes at multiple thoracic levels.  IMPRESSION: The findings are consistent with congestive heart failure with pulmonary vascular congestion and mild interstitial edema. There is no focal pneumonia.   Electronically Signed   By: David  [place]   On: 03/23/2013 12:39   Dg Chest Port 1 View  03/28/2013   CLINICAL DATA:  Pneumonia.  EXAM: PORTABLE  CHEST - 1 VIEW  COMPARISON:  06/13/2012  FINDINGS: Cardiomegaly.  Central pulmonary vascular prominence.  No segmental consolidation or gross pneumothorax.  Calcified aorta.  IMPRESSION: Cardiomegaly.  Central pulmonary vascular prominence.  No segmental consolidation or gross pneumothorax.   Electronically Signed   By: Bridgett Larsson M.D.   On: 03/28/2013 08:03   Dg Chest Port 1 View  03/25/2013   CLINICAL DATA:  Shortness of breath  EXAM: PORTABLE CHEST - 1 VIEW  COMPARISON:  03/23/2013  FINDINGS: Low lung volumes. No new focal regions of consolidation or new focal infiltrates. The interstitial findings are slightly more prominent. Cardiac silhouette is enlarged. A right shoulder arthroplasty is identified.  IMPRESSION: Slight increased prominence of interstitial findings may represent mild pulmonary edema. An infectious or inflammatory interstitial infiltrate cannot be excluded clinically appropriate.   Electronically Signed   By: Salome Holmes M.D.  On: 03/25/2013 16:02   Dg Swallowing Func-speech Pathology  03/26/2013   Breck Coons [place], CCC-SLP     03/26/2013  9:51 AM Objective Swallowing Evaluation: Modified Barium Swallowing Study   Patient Details  Name: Deborah Watkins MRN: [id number] Date of Birth: Apr 30, 1930  Today's Date: 03/26/2013 Time: 0912-0930 SLP Time Calculation (min): 18 min  Past Medical History:  Past Medical History  Diagnosis Date  . Mental retardation   . Hypertension   . Diabetes mellitus   . Arthritis    Past Surgical History: History reviewed. No pertinent past  surgical history. HPI:  80 y.o. female admitted to [facility] on 03/23/13 with cough/SOB.  Dx  with bronchitis.  Of note, pt lives with her sister and is  mentally handicapped.  Sister reports frequent choking at home.     Assessment / Plan / Recommendation Clinical Impression  Dysphagia Diagnosis: Moderate pharyngeal phase dysphagia Clinical impression: Pt. exhibited moderate pharyngeal dyphagia  with both sensory and motor  impairments.  Delayed swallow  initiation led to frank aspiration just prior to initiating  swallow with teaspoon nectar barium.  Pt. not likely to comply  with swallow strategies due to cognitive deficits.  She appeared  to have C2-3 osteophyte (fusion?) however epiglottis is below  this level and inversion is not impaired, however cannot rule out  some degree dyscoordination.  Oral prep and mastication with  cracker was WFL's.  Brief esophageal scan did not reveal  abnormalitites (study does not diagnose below level of UES).  SLP  recommends a Dys 3 diet and honey thick liquids with 2 swallows,  pills whole in applesauce and full supervision.  Uncertain of  dysphagia prognosis without known etiology (likley chronic with  question of acute factors?).  ST will follow for safety and  continued education with sister.      Treatment Recommendation  Therapy as outlined in treatment plan below    Diet Recommendation Dysphagia 3 (Mechanical Soft);Honey-thick  liquid   Liquid Administration via: Cup;No straw Medication Administration: Whole meds with puree Supervision: Patient able to self feed;Full supervision/cueing  for compensatory strategies Compensations: Slow rate;Small sips/bites;Multiple dry swallows  after each bite/sip Postural Changes and/or Swallow Maneuvers: Seated upright 90  degrees    Other  Recommendations Oral Care Recommendations: Oral care BID   Follow Up Recommendations  Home health SLP    Frequency and Duration min 2x/week  2 weeks   Pertinent Vitals/Pain WDL           Reason for Referral Objectively evaluate swallowing function   Oral Phase Oral Preparation/Oral Phase Oral Phase: WFL   Pharyngeal Phase Pharyngeal Phase Pharyngeal Phase: Impaired Pharyngeal - Honey Pharyngeal - Honey Teaspoon: Delayed swallow initiation;Premature  spillage to valleculae;Pharyngeal residue - valleculae;Pharyngeal  residue - pyriform sinuses;Reduced tongue base retraction;Reduced  laryngeal elevation Pharyngeal - Honey  Cup: Pharyngeal residue - pyriform  sinuses;Pharyngeal residue - valleculae;Reduced laryngeal  elevation;Reduced tongue base retraction;Delayed swallow  initiation;Premature spillage to valleculae Pharyngeal - Nectar Pharyngeal - Nectar Teaspoon: Delayed swallow  initiation;Premature spillage to  valleculae;Penetration/Aspiration before swallow;Pharyngeal  residue - pyriform sinuses;Pharyngeal residue -  valleculae;Reduced tongue base retraction;Reduced laryngeal  elevation;Reduced airway/laryngeal closure Penetration/Aspiration details (nectar teaspoon): Material enters  airway, passes BELOW cords and not ejected out despite cough  attempt by patient Pharyngeal - Solids Pharyngeal - Puree: Delayed swallow initiation;Premature spillage  to valleculae;Pharyngeal residue - valleculae;Pharyngeal residue  - pyriform sinuses;Reduced tongue base retraction;Reduced  laryngeal elevation Pharyngeal - Regular: Pharyngeal residue - valleculae;Pharyngeal  residue -  pyriform sinuses;Reduced tongue base retraction;Reduced  laryngeal elevation  Cervical Esophageal Phase    GO    Cervical Esophageal Phase Cervical Esophageal Phase: Impaired Cervical Esophageal Phase - Comment Cervical Esophageal Comment:  (mild stasis)         Breck Coons Litaker M.Ed CCC-SLP Pager [phone]  03/26/2013     Microbiology: Recent Results (from the past 240 hour(s))  URINE CULTURE     Status: None   Collection Time    03/23/13  1:20 PM      Result Value Range Status   Specimen Description URINE, RANDOM   Final   Special Requests NONE   Final   Culture  Setup Time     Final   Value: 03/23/2013 20:01     Performed at [facility] Count     Final   Value: >=100,000 COLONIES/ML     Performed at [facility]   Culture     Final   Value: ESCHERICHIA COLI     Performed at [facility]   Report Status 03/25/2013 FINAL   Final   Organism ID, Bacteria ESCHERICHIA COLI   Final     Labs: Basic Metabolic  Panel:  Recent Labs Lab 03/23/13 1348 03/24/13 0300 03/25/13 1550 03/27/13 0648 03/28/13 0835  NA 136 134* 139 144 146  K 3.2* 4.0 3.4* 2.6* 3.2*  CL 93* 93* 95* 94* 95*  CO2 32 31 27 37* 32  GLUCOSE 82 123* 262* 166* 267*  BUN 20 20 27* 32* 31*  CREATININE 0.79 0.80 0.88 0.77 0.76  CALCIUM 9.0 8.7 8.1* 8.4 8.7  MG  --   --   --  2.5 2.5   Liver Function Tests:  Recent Labs Lab 03/24/13 0300 03/25/13 1550  AST 57* 115*  ALT 33 60*  ALKPHOS 274* 263*  BILITOT 0.4 0.4  PROT 6.6 6.5  ALBUMIN 2.8* 2.8*   No results found for this basename: LIPASE, AMYLASE,  in the last 168 hours No results found for this basename: AMMONIA,  in the last 168 hours CBC:  Recent Labs Lab 03/23/13 1348 03/24/13 0300 03/25/13 1550  WBC 9.2 8.0 12.9*  NEUTROABS 8.0*  --  11.8*  HGB 11.4* 10.5* 10.1*  HCT 35.2* 32.8* 31.4*  MCV 77.9* 78.5 77.9*  PLT 109* 92* 128*   Cardiac Enzymes:  Recent Labs Lab 03/23/13 1348 03/23/13 2210 03/24/13 0300 03/24/13 0910  TROPONINI <0.30 <0.30 <0.30 <0.30   BNP: BNP (last 3 results)  Recent Labs  03/23/13 1348 03/25/13 1550 03/27/13 0648  PROBNP 488.0* 914.3* 1032.0*   CBG:  Recent Labs Lab 03/28/13 1125 03/28/13 1641 03/28/13 2014 03/29/13 0753 03/29/13 1148  GLUCAP 306* 215* 222* 165* 255*       Signed:  Prestyn Mahn L  [facility] 03/29/2013, 1:14 PM

## 2013-09-02 ENCOUNTER — Telehealth: Payer: Self-pay

## 2013-09-02 NOTE — Telephone Encounter (Signed)
Patient past away @ Home per [facility]  °

## 2013-09-24 DEATH — deceased

## 2014-01-18 IMAGING — CT CT HEAD W/O CM
4 series · 19 of 30 positions shown, 20 images · non-contrast
Comparison: None.

CLINICAL DATA: Hypertension, unsteady on feet.

CT HEAD WITHOUT CONTRAST
TECHNIQUE: Contiguous axial images were obtained from the base of
the skull through the vertex without contrast.

[Series 2: (id) head 4.8 h37s st · axial · 0.50mm/px · z∈[-26,+59]mm · 3 of 36 slices shown, 4 images (1 of 2)]
[im 9/36  brain]
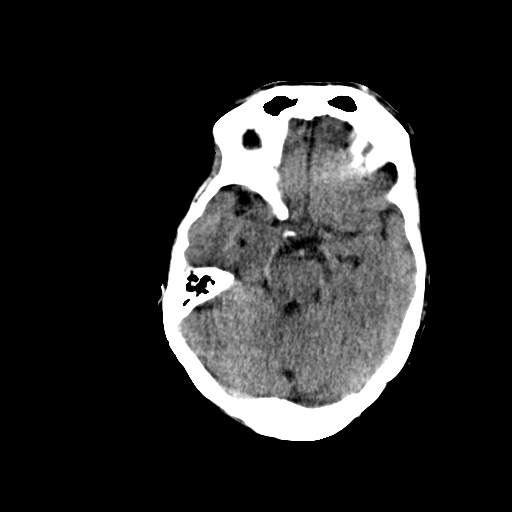
[im 9/36  bone]
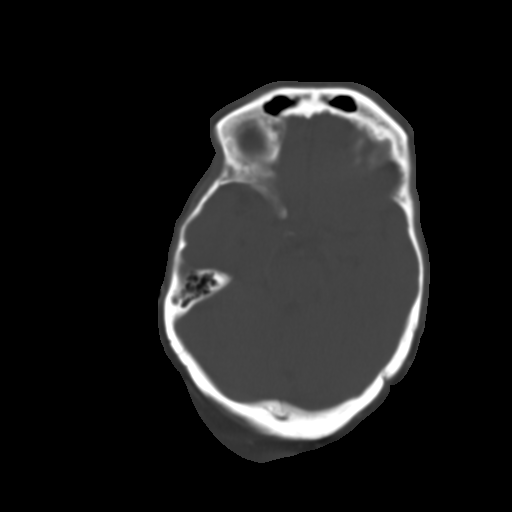
[im 18/36  brain]
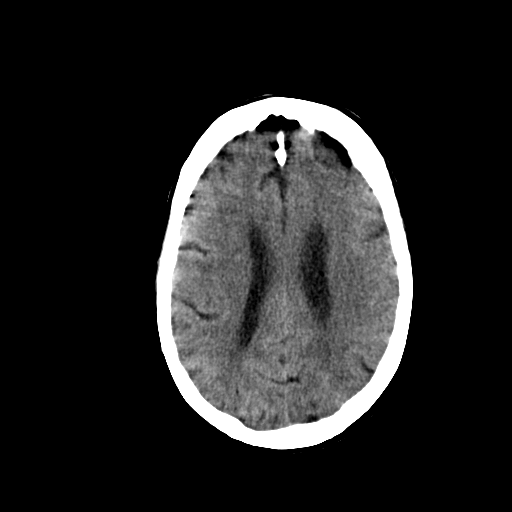
[im 27/36  brain]
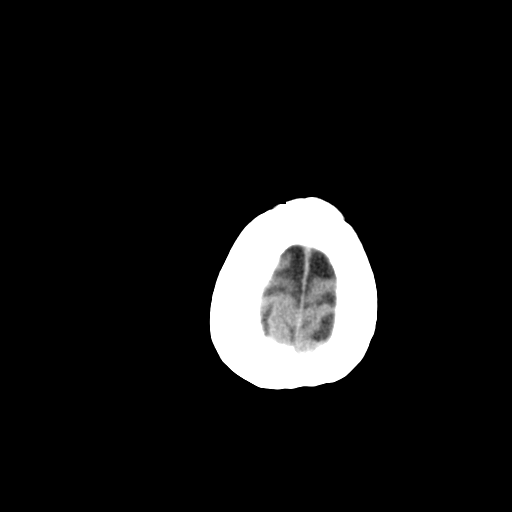

[Series 3: (id) head 2.4 h60s bone · axial · 0.50mm/px · z∈[-49,+84]mm · 8 of 72 slices shown (1 of 2)]
[im 8/72  bone]
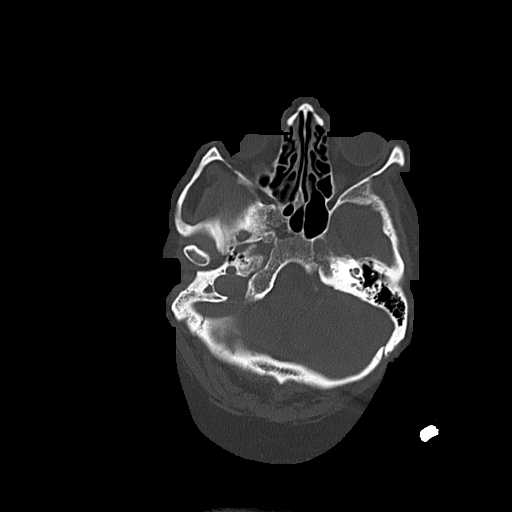
[im 16/72  bone]
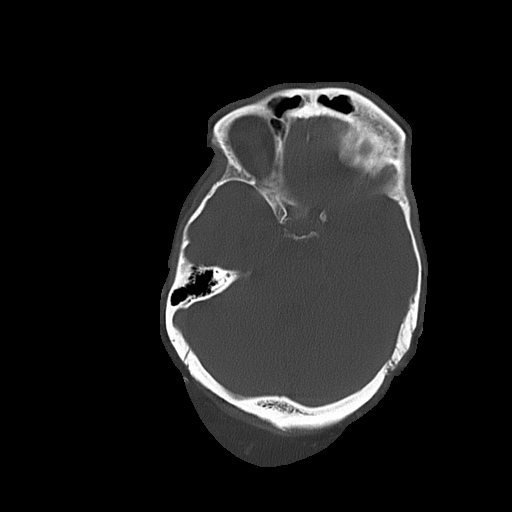
[im 24/72  bone]
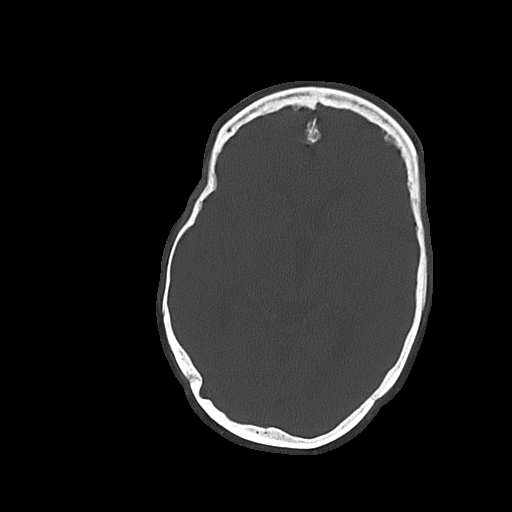
[im 32/72  bone]
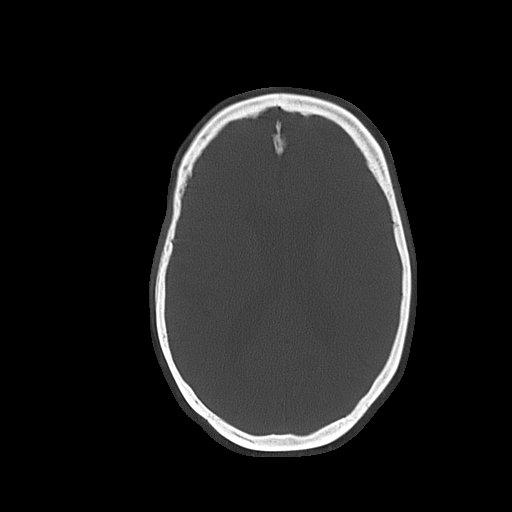
[im 40/72  bone]
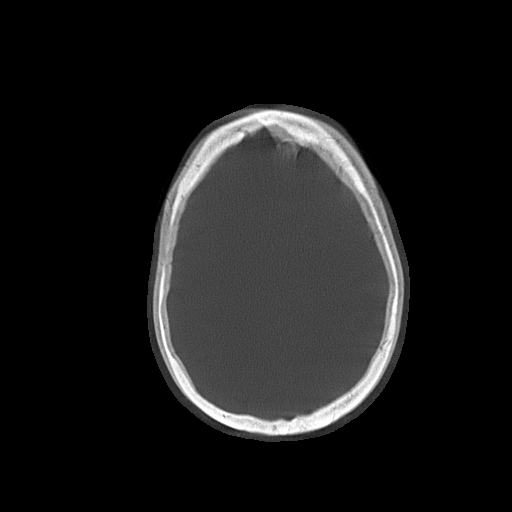
[im 48/72  bone]
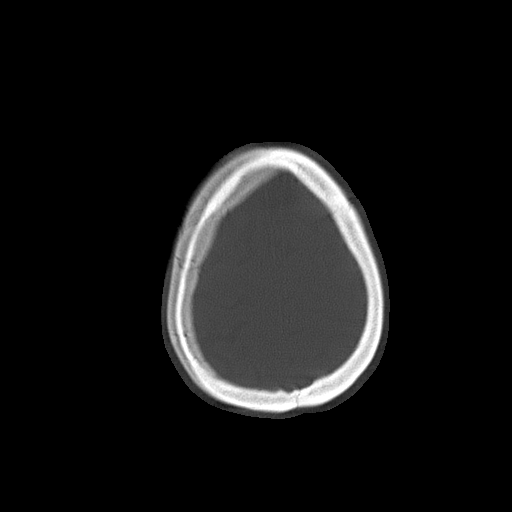
[im 56/72  bone]
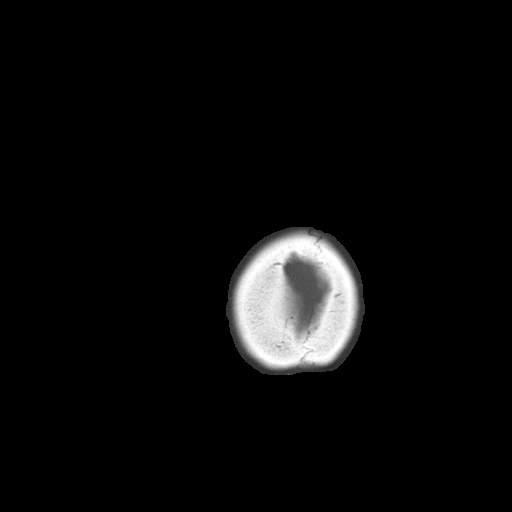
[im 64/72  bone]
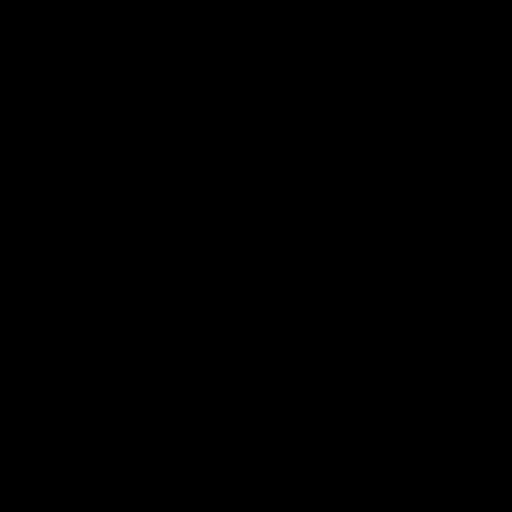

[Series 4: (id) head 4.8 h37s st · axial · 0.50mm/px · z∈[-26,+59]mm · 3 of 36 slices shown (2 of 2)]
[im 9/36  brain]
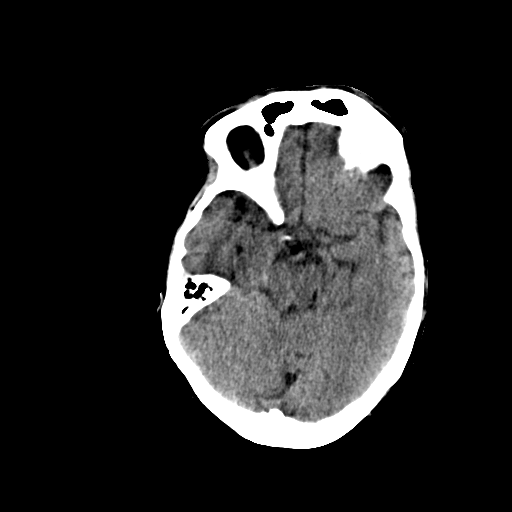
[im 18/36  brain]
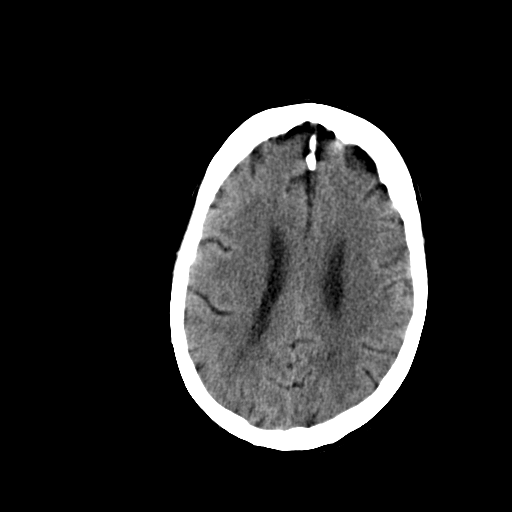
[im 27/36  brain]
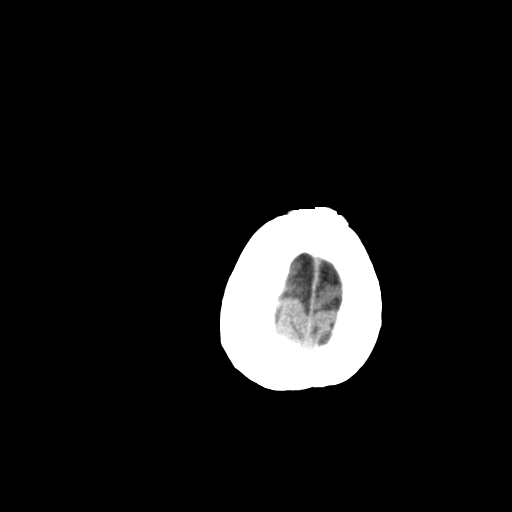

[Series 5: (id) head 2.4 h60s bone · axial · 0.50mm/px · z∈[-49,+27]mm · 5 of 72 slices shown (2 of 2)]
[im 8/72  bone]
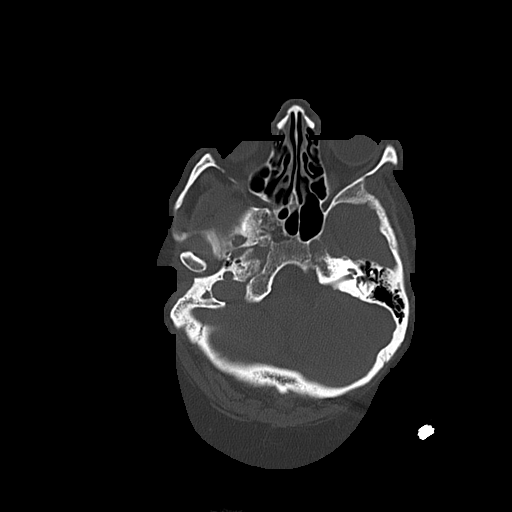
[im 16/72  bone]
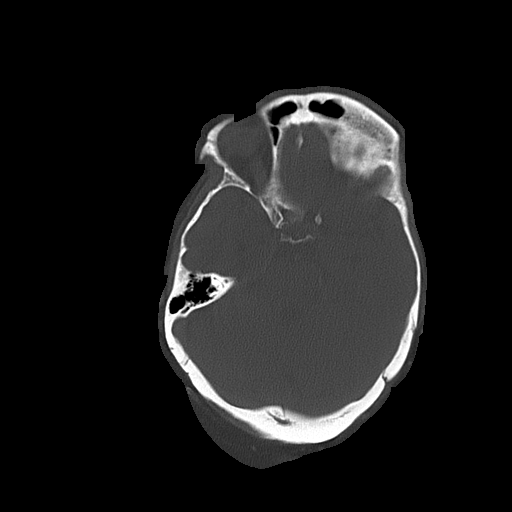
[im 24/72  bone]
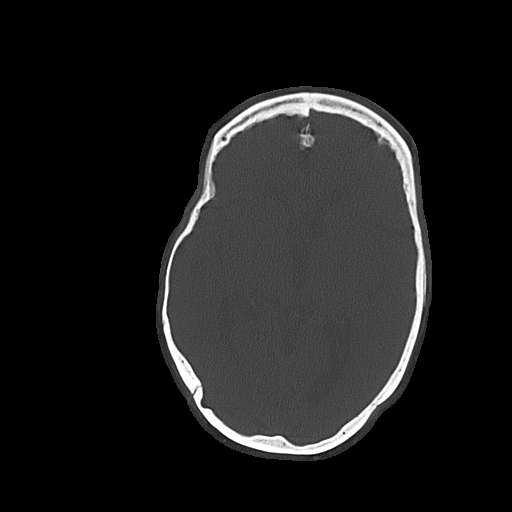
[im 32/72  bone]
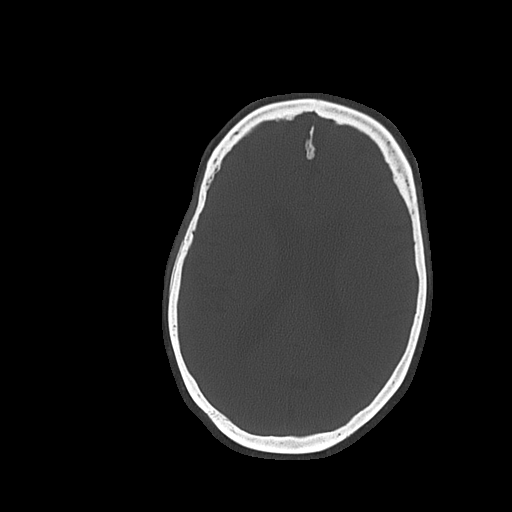
[im 40/72  bone]
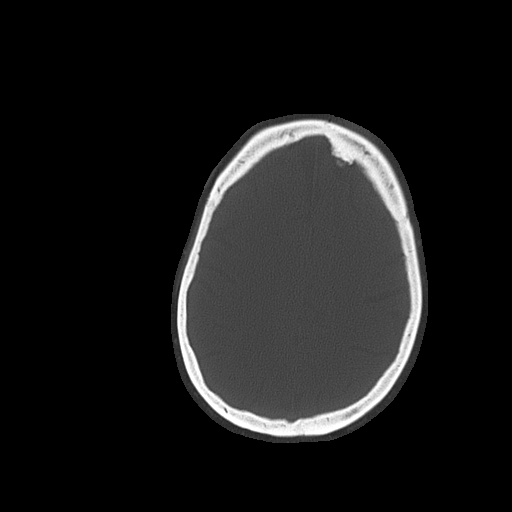

[19 of 30 positions shown; findings below may reference images not displayed]

FINDINGS: Bony calvarium appears intact.  No mass effect or midline
shift is noted.  Ventricular size is within normal limits.  There
is no evidence of mass, hemorrhage or infarction.
IMPRESSION: No gross intracranial abnormality seen.

## 2014-01-18 IMAGING — CR DG CHEST 1V PORT
1 series · 1 of 1 positions shown · non-contrast
Comparison: 04/02/2007

CLINICAL DATA: 81-year-old female with acute hypertension.  Ataxia

PORTABLE CHEST - 1 VIEW

[AP]
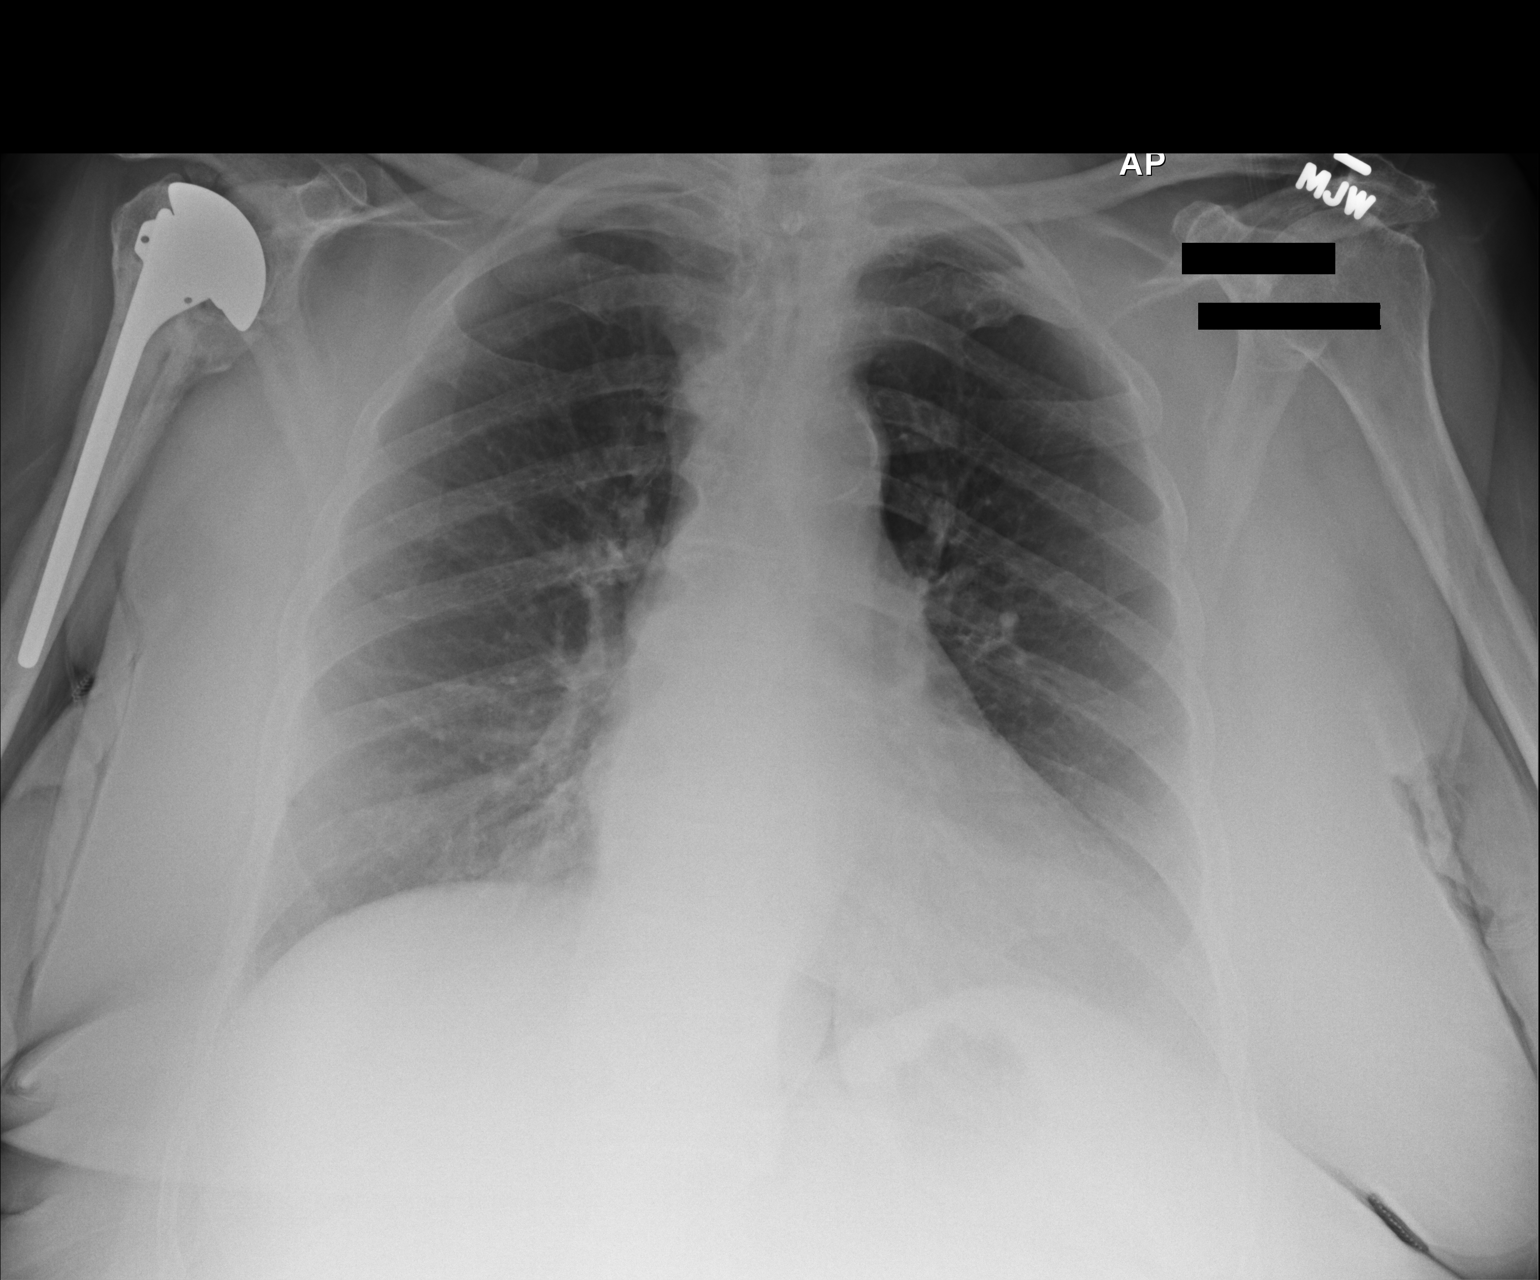

[1 of 1 positions shown; findings below may reference images not displayed]

FINDINGS: The cardiomediastinal silhouette is unremarkable.
There is no evidence of focal airspace disease, pulmonary edema,
suspicious pulmonary nodule/mass, pleural effusion, or
pneumothorax.
No acute bony abnormalities are identified.
Right shoulder hemiarthroplasty again noted.
IMPRESSION: No evidence of active cardiopulmonary disease.
